# Patient Record
Sex: Male | Born: 1955 | Race: Black or African American | Hispanic: No | Marital: Single | Smoking: Current every day smoker
Health system: Southern US, Community
[De-identification: ages and names within clinical notes are randomized; demographics above are authoritative.]

## PROBLEM LIST (undated history)

## (undated) DIAGNOSIS — Z87448 Personal history of other diseases of urinary system: Secondary | ICD-10-CM

## (undated) DIAGNOSIS — I272 Pulmonary hypertension, unspecified: Secondary | ICD-10-CM

## (undated) DIAGNOSIS — K859 Acute pancreatitis without necrosis or infection, unspecified: Secondary | ICD-10-CM

## (undated) DIAGNOSIS — I499 Cardiac arrhythmia, unspecified: Secondary | ICD-10-CM

## (undated) DIAGNOSIS — K922 Gastrointestinal hemorrhage, unspecified: Secondary | ICD-10-CM

## (undated) DIAGNOSIS — J90 Pleural effusion, not elsewhere classified: Secondary | ICD-10-CM

## (undated) DIAGNOSIS — Z992 Dependence on renal dialysis: Secondary | ICD-10-CM

## (undated) DIAGNOSIS — G4700 Insomnia, unspecified: Secondary | ICD-10-CM

## (undated) DIAGNOSIS — D649 Anemia, unspecified: Secondary | ICD-10-CM

## (undated) DIAGNOSIS — N186 End stage renal disease: Secondary | ICD-10-CM

## (undated) DIAGNOSIS — F32A Depression, unspecified: Secondary | ICD-10-CM

## (undated) DIAGNOSIS — Z720 Tobacco use: Secondary | ICD-10-CM

## (undated) DIAGNOSIS — A048 Other specified bacterial intestinal infections: Secondary | ICD-10-CM

## (undated) DIAGNOSIS — I4891 Unspecified atrial fibrillation: Secondary | ICD-10-CM

## (undated) DIAGNOSIS — J45909 Unspecified asthma, uncomplicated: Secondary | ICD-10-CM

## (undated) DIAGNOSIS — G4733 Obstructive sleep apnea (adult) (pediatric): Secondary | ICD-10-CM

## (undated) DIAGNOSIS — F329 Major depressive disorder, single episode, unspecified: Secondary | ICD-10-CM

## (undated) DIAGNOSIS — M109 Gout, unspecified: Secondary | ICD-10-CM

## (undated) DIAGNOSIS — J449 Chronic obstructive pulmonary disease, unspecified: Secondary | ICD-10-CM

## (undated) DIAGNOSIS — I10 Essential (primary) hypertension: Secondary | ICD-10-CM

## (undated) HISTORY — DX: Pulmonary hypertension, unspecified: I27.20

## (undated) HISTORY — DX: Chronic obstructive pulmonary disease, unspecified: J44.9

## (undated) HISTORY — DX: Unspecified asthma, uncomplicated: J45.909

## (undated) HISTORY — PX: OTHER SURGICAL HISTORY: SHX169

## (undated) HISTORY — DX: Insomnia, unspecified: G47.00

## (undated) HISTORY — DX: Essential (primary) hypertension: I10

## (undated) HISTORY — DX: Anemia, unspecified: D64.9

## (undated) HISTORY — DX: Depression, unspecified: F32.A

## (undated) HISTORY — DX: Obstructive sleep apnea (adult) (pediatric): G47.33

## (undated) HISTORY — DX: Tobacco use: Z72.0

## (undated) HISTORY — DX: Cardiac arrhythmia, unspecified: I49.9

## (undated) HISTORY — DX: Pleural effusion, not elsewhere classified: J90

## (undated) HISTORY — DX: End stage renal disease: N18.6

## (undated) HISTORY — DX: Gastrointestinal hemorrhage, unspecified: K92.2

## (undated) HISTORY — DX: Personal history of other diseases of urinary system: Z87.448

## (undated) HISTORY — DX: Unspecified atrial fibrillation: I48.91

## (undated) HISTORY — DX: Acute pancreatitis without necrosis or infection, unspecified: K85.90

## (undated) HISTORY — DX: Dependence on renal dialysis: Z99.2

## (undated) HISTORY — DX: Major depressive disorder, single episode, unspecified: F32.9

## (undated) HISTORY — DX: Gout, unspecified: M10.9

## (undated) HISTORY — DX: Other specified bacterial intestinal infections: A04.8

---

## 2014-01-14 ENCOUNTER — Emergency Department: Payer: Self-pay | Admitting: Emergency Medicine

## 2014-01-14 LAB — CBC
HCT: 39.3 % — ABNORMAL LOW (ref 40.0–52.0)
HGB: 12.8 g/dL — ABNORMAL LOW (ref 13.0–18.0)
MCH: 31.5 pg (ref 26.0–34.0)
MCHC: 32.6 g/dL (ref 32.0–36.0)
MCV: 97 fL (ref 80–100)
Platelet: 189 10*3/uL (ref 150–440)
RBC: 4.06 10*6/uL — AB (ref 4.40–5.90)
RDW: 14.8 % — ABNORMAL HIGH (ref 11.5–14.5)
WBC: 12.1 10*3/uL — ABNORMAL HIGH (ref 3.8–10.6)

## 2014-01-14 LAB — COMPREHENSIVE METABOLIC PANEL
AST: 12 U/L — AB (ref 15–37)
Albumin: 3 g/dL — ABNORMAL LOW (ref 3.4–5.0)
Alkaline Phosphatase: 175 U/L — ABNORMAL HIGH
Anion Gap: 9 (ref 7–16)
BUN: 33 mg/dL — AB (ref 7–18)
Bilirubin,Total: 0.7 mg/dL (ref 0.2–1.0)
CALCIUM: 8.8 mg/dL (ref 8.5–10.1)
CREATININE: 6.54 mg/dL — AB (ref 0.60–1.30)
Chloride: 98 mmol/L (ref 98–107)
Co2: 29 mmol/L (ref 21–32)
EGFR (African American): 10 — ABNORMAL LOW
EGFR (Non-African Amer.): 9 — ABNORMAL LOW
GLUCOSE: 100 mg/dL — AB (ref 65–99)
OSMOLALITY: 279 (ref 275–301)
Potassium: 4.2 mmol/L (ref 3.5–5.1)
SGPT (ALT): 9 U/L — ABNORMAL LOW
Sodium: 136 mmol/L (ref 136–145)
Total Protein: 8.3 g/dL — ABNORMAL HIGH (ref 6.4–8.2)

## 2014-01-14 LAB — CK TOTAL AND CKMB (NOT AT ARMC)
CK, Total: 90 U/L
CK-MB: 1.1 ng/mL (ref 0.5–3.6)

## 2014-01-14 LAB — TROPONIN I: Troponin-I: <0.02 ng/mL

## 2014-01-28 ENCOUNTER — Inpatient Hospital Stay: Payer: Self-pay | Admitting: Internal Medicine

## 2014-01-28 ENCOUNTER — Other Ambulatory Visit: Payer: Self-pay | Admitting: Nurse Practitioner

## 2014-01-28 DIAGNOSIS — J9600 Acute respiratory failure, unspecified whether with hypoxia or hypercapnia: Secondary | ICD-10-CM

## 2014-01-28 DIAGNOSIS — I4891 Unspecified atrial fibrillation: Secondary | ICD-10-CM

## 2014-01-28 DIAGNOSIS — I369 Nonrheumatic tricuspid valve disorder, unspecified: Secondary | ICD-10-CM

## 2014-01-28 LAB — CBC WITH DIFFERENTIAL/PLATELET
Basophil #: 0 10*3/uL (ref 0.0–0.1)
Basophil %: 0.2 %
EOS PCT: 0.1 %
Eosinophil #: 0 10*3/uL (ref 0.0–0.7)
HCT: 36.5 % — AB (ref 40.0–52.0)
HGB: 11.6 g/dL — ABNORMAL LOW (ref 13.0–18.0)
LYMPHS ABS: 1.1 10*3/uL (ref 1.0–3.6)
Lymphocyte %: 4.2 %
MCH: 31.2 pg (ref 26.0–34.0)
MCHC: 31.7 g/dL — AB (ref 32.0–36.0)
MCV: 99 fL (ref 80–100)
MONO ABS: 1.6 x10 3/mm — AB (ref 0.2–1.0)
Monocyte %: 5.9 %
Neutrophil #: 24.3 10*3/uL — ABNORMAL HIGH (ref 1.4–6.5)
Neutrophil %: 89.6 %
Platelet: 155 10*3/uL (ref 150–440)
RBC: 3.71 10*6/uL — AB (ref 4.40–5.90)
RDW: 14.7 % — AB (ref 11.5–14.5)
WBC: 27.1 10*3/uL — ABNORMAL HIGH (ref 3.8–10.6)

## 2014-01-28 LAB — PROTIME-INR
INR: 1.4
Prothrombin Time: 17.1 secs — ABNORMAL HIGH (ref 11.5–14.7)

## 2014-01-28 LAB — CK-MB
CK-MB: 6.2 ng/mL — ABNORMAL HIGH (ref 0.5–3.6)
CK-MB: 4.9 ng/mL — ABNORMAL HIGH (ref 0.5–3.6)
CK-MB: 3.9 ng/mL — ABNORMAL HIGH (ref 0.5–3.6)

## 2014-01-28 LAB — PRO B NATRIURETIC PEPTIDE: B-Type Natriuretic Peptide: 97040 pg/mL — ABNORMAL HIGH (ref 0–125)

## 2014-01-28 LAB — COMPREHENSIVE METABOLIC PANEL
ALK PHOS: 132 U/L — AB
ANION GAP: 12 (ref 7–16)
Albumin: 2.5 g/dL — ABNORMAL LOW (ref 3.4–5.0)
BUN: 17 mg/dL (ref 7–18)
Bilirubin,Total: 1 mg/dL (ref 0.2–1.0)
CREATININE: 4.77 mg/dL — AB (ref 0.60–1.30)
Calcium, Total: 6.6 mg/dL — CL (ref 8.5–10.1)
Chloride: 103 mmol/L (ref 98–107)
Co2: 24 mmol/L (ref 21–32)
EGFR (African American): 15 — ABNORMAL LOW
EGFR (Non-African Amer.): 13 — ABNORMAL LOW
Glucose: 71 mg/dL (ref 65–99)
Osmolality: 278 (ref 275–301)
Potassium: 3.8 mmol/L (ref 3.5–5.1)
SGOT(AST): 33 U/L (ref 15–37)
SGPT (ALT): 12 U/L — ABNORMAL LOW
SODIUM: 139 mmol/L (ref 136–145)
Total Protein: 7.7 g/dL (ref 6.4–8.2)

## 2014-01-28 LAB — TROPONIN I
TROPONIN-I: 0.61 ng/mL — AB
Troponin-I: 0.58 ng/mL — ABNORMAL HIGH
Troponin-I: 0.7 ng/mL — ABNORMAL HIGH

## 2014-01-28 LAB — HEPARIN LEVEL (UNFRACTIONATED): Anti-Xa(Unfractionated): 0.16 IU/mL — ABNORMAL LOW (ref 0.30–0.70)

## 2014-01-28 LAB — CK TOTAL AND CKMB (NOT AT ARMC)
CK, Total: 540 U/L — ABNORMAL HIGH
CK-MB: 5.9 ng/mL — AB (ref 0.5–3.6)

## 2014-01-29 LAB — CBC WITH DIFFERENTIAL/PLATELET
BASOS PCT: 0.2 %
Basophil #: 0 10*3/uL (ref 0.0–0.1)
EOS ABS: 0 10*3/uL (ref 0.0–0.7)
Eosinophil %: 0.1 %
HCT: 30.8 % — ABNORMAL LOW (ref 40.0–52.0)
HGB: 10.1 g/dL — ABNORMAL LOW (ref 13.0–18.0)
Lymphocyte #: 0.4 10*3/uL — ABNORMAL LOW (ref 1.0–3.6)
Lymphocyte %: 2.6 %
MCH: 31.4 pg (ref 26.0–34.0)
MCHC: 32.9 g/dL (ref 32.0–36.0)
MCV: 96 fL (ref 80–100)
Monocyte #: 0.4 x10 3/mm (ref 0.2–1.0)
Monocyte %: 2.6 %
NEUTROS PCT: 94.5 %
Neutrophil #: 13.4 10*3/uL — ABNORMAL HIGH (ref 1.4–6.5)
PLATELETS: 120 10*3/uL — AB (ref 150–440)
RBC: 3.23 10*6/uL — AB (ref 4.40–5.90)
RDW: 14.7 % — AB (ref 11.5–14.5)
WBC: 14.2 10*3/uL — AB (ref 3.8–10.6)

## 2014-01-29 LAB — BASIC METABOLIC PANEL
Anion Gap: 11 (ref 7–16)
BUN: 27 mg/dL — ABNORMAL HIGH (ref 7–18)
CALCIUM: 6.7 mg/dL — AB (ref 8.5–10.1)
CHLORIDE: 96 mmol/L — AB (ref 98–107)
CO2: 25 mmol/L (ref 21–32)
Creatinine: 6.91 mg/dL — ABNORMAL HIGH (ref 0.60–1.30)
GFR CALC AF AMER: 9 — AB
GFR CALC NON AF AMER: 8 — AB
GLUCOSE: 195 mg/dL — AB (ref 65–99)
OSMOLALITY: 275 (ref 275–301)
POTASSIUM: 4 mmol/L (ref 3.5–5.1)
Sodium: 132 mmol/L — ABNORMAL LOW (ref 136–145)

## 2014-01-29 LAB — PHOSPHORUS: PHOSPHORUS: 3.2 mg/dL (ref 2.5–4.9)

## 2014-01-29 LAB — LIPID PANEL
CHOLESTEROL: 72 mg/dL (ref 0–200)
HDL: 11 mg/dL — AB (ref 40–60)
Ldl Cholesterol, Calc: 31 mg/dL (ref 0–100)
Triglycerides: 152 mg/dL (ref 0–200)
VLDL CHOLESTEROL, CALC: 30 mg/dL (ref 5–40)

## 2014-01-29 LAB — HEPARIN LEVEL (UNFRACTIONATED)
ANTI-XA(UNFRACTIONATED): 0.42 [IU]/mL (ref 0.30–0.70)
ANTI-XA(UNFRACTIONATED): 0.27 [IU]/mL — AB (ref 0.30–0.70)

## 2014-01-29 LAB — TSH: Thyroid Stimulating Horm: 3.18 u[IU]/mL

## 2014-01-30 LAB — CBC WITH DIFFERENTIAL/PLATELET
Basophil #: 0 10*3/uL (ref 0.0–0.1)
Basophil %: 0.1 %
EOS PCT: 0.2 %
Eosinophil #: 0 10*3/uL (ref 0.0–0.7)
HCT: 30.7 % — ABNORMAL LOW (ref 40.0–52.0)
HGB: 9.8 g/dL — ABNORMAL LOW (ref 13.0–18.0)
LYMPHS PCT: 6.8 %
Lymphocyte #: 0.8 10*3/uL — ABNORMAL LOW (ref 1.0–3.6)
MCH: 30.4 pg (ref 26.0–34.0)
MCHC: 31.9 g/dL — ABNORMAL LOW (ref 32.0–36.0)
MCV: 95 fL (ref 80–100)
MONOS PCT: 7.2 %
Monocyte #: 0.8 x10 3/mm (ref 0.2–1.0)
NEUTROS ABS: 10 10*3/uL — AB (ref 1.4–6.5)
NEUTROS PCT: 85.7 %
PLATELETS: 117 10*3/uL — AB (ref 150–440)
RBC: 3.22 10*6/uL — ABNORMAL LOW (ref 4.40–5.90)
RDW: 14.6 % — AB (ref 11.5–14.5)
WBC: 11.7 10*3/uL — ABNORMAL HIGH (ref 3.8–10.6)

## 2014-01-30 LAB — PHOSPHORUS: Phosphorus: 3.1 mg/dL (ref 2.5–4.9)

## 2014-01-30 LAB — HEPARIN LEVEL (UNFRACTIONATED): Anti-Xa(Unfractionated): 0.43 IU/mL (ref 0.30–0.70)

## 2014-01-31 DIAGNOSIS — I4891 Unspecified atrial fibrillation: Secondary | ICD-10-CM

## 2014-01-31 LAB — CBC WITH DIFFERENTIAL/PLATELET
Basophil #: 0 10*3/uL (ref 0.0–0.1)
Basophil %: 0.1 %
EOS ABS: 0 10*3/uL (ref 0.0–0.7)
Eosinophil %: 0 %
HCT: 31.7 % — ABNORMAL LOW (ref 40.0–52.0)
HGB: 10.4 g/dL — ABNORMAL LOW (ref 13.0–18.0)
Lymphocyte #: 1 10*3/uL (ref 1.0–3.6)
Lymphocyte %: 8.2 %
MCH: 31.1 pg (ref 26.0–34.0)
MCHC: 32.8 g/dL (ref 32.0–36.0)
MCV: 95 fL (ref 80–100)
MONO ABS: 0.9 x10 3/mm (ref 0.2–1.0)
MONOS PCT: 7.6 %
NEUTROS PCT: 84.1 %
Neutrophil #: 10.1 10*3/uL — ABNORMAL HIGH (ref 1.4–6.5)
Platelet: 127 10*3/uL — ABNORMAL LOW (ref 150–440)
RBC: 3.35 10*6/uL — ABNORMAL LOW (ref 4.40–5.90)
RDW: 14.9 % — ABNORMAL HIGH (ref 11.5–14.5)
WBC: 12 10*3/uL — ABNORMAL HIGH (ref 3.8–10.6)

## 2014-01-31 LAB — BASIC METABOLIC PANEL
ANION GAP: 10 (ref 7–16)
BUN: 31 mg/dL — ABNORMAL HIGH (ref 7–18)
CHLORIDE: 96 mmol/L — AB (ref 98–107)
Calcium, Total: 7.8 mg/dL — ABNORMAL LOW (ref 8.5–10.1)
Co2: 32 mmol/L (ref 21–32)
Creatinine: 5.32 mg/dL — ABNORMAL HIGH (ref 0.60–1.30)
EGFR (African American): 13 — ABNORMAL LOW
EGFR (Non-African Amer.): 11 — ABNORMAL LOW
GLUCOSE: 135 mg/dL — AB (ref 65–99)
OSMOLALITY: 284 (ref 275–301)
Potassium: 4 mmol/L (ref 3.5–5.1)
Sodium: 138 mmol/L (ref 136–145)

## 2014-01-31 LAB — CULTURE, BLOOD (SINGLE)

## 2014-02-01 LAB — BASIC METABOLIC PANEL
Anion Gap: 12 (ref 7–16)
BUN: 58 mg/dL — AB (ref 7–18)
CO2: 28 mmol/L (ref 21–32)
CREATININE: 7.47 mg/dL — AB (ref 0.60–1.30)
Calcium, Total: 7.9 mg/dL — ABNORMAL LOW (ref 8.5–10.1)
Chloride: 95 mmol/L — ABNORMAL LOW (ref 98–107)
EGFR (African American): 8 — ABNORMAL LOW
EGFR (Non-African Amer.): 7 — ABNORMAL LOW
Glucose: 139 mg/dL — ABNORMAL HIGH (ref 65–99)
Osmolality: 289 (ref 275–301)
POTASSIUM: 4.5 mmol/L (ref 3.5–5.1)
Sodium: 135 mmol/L — ABNORMAL LOW (ref 136–145)

## 2014-02-01 LAB — CBC WITH DIFFERENTIAL/PLATELET
Bands: 2 %
HCT: 32.1 % — AB (ref 40.0–52.0)
HGB: 10.2 g/dL — ABNORMAL LOW (ref 13.0–18.0)
LYMPHS PCT: 18 %
MCH: 30.6 pg (ref 26.0–34.0)
MCHC: 31.9 g/dL — ABNORMAL LOW (ref 32.0–36.0)
MCV: 96 fL (ref 80–100)
MONOS PCT: 6 %
MYELOCYTE: 1 %
Metamyelocyte: 1 %
NRBC/100 WBC: 1 /
Platelet: 129 10*3/uL — ABNORMAL LOW (ref 150–440)
RBC: 3.34 10*6/uL — AB (ref 4.40–5.90)
RDW: 14.6 % — ABNORMAL HIGH (ref 11.5–14.5)
SEGMENTED NEUTROPHILS: 72 %
WBC: 12.3 10*3/uL — ABNORMAL HIGH (ref 3.8–10.6)

## 2014-02-02 LAB — CBC WITH DIFFERENTIAL/PLATELET
Comment - H1-Com2: NORMAL
HCT: 32.9 % — ABNORMAL LOW (ref 40.0–52.0)
HGB: 10.5 g/dL — AB (ref 13.0–18.0)
Lymphocytes: 10 %
MCH: 30.3 pg (ref 26.0–34.0)
MCHC: 31.9 g/dL — AB (ref 32.0–36.0)
MCV: 95 fL (ref 80–100)
Metamyelocyte: 1 %
Monocytes: 8 %
Platelet: 159 10*3/uL (ref 150–440)
RBC: 3.47 10*6/uL — ABNORMAL LOW (ref 4.40–5.90)
RDW: 14.8 % — ABNORMAL HIGH (ref 11.5–14.5)
Segmented Neutrophils: 81 %
WBC: 14.6 10*3/uL — ABNORMAL HIGH (ref 3.8–10.6)

## 2014-02-02 LAB — BASIC METABOLIC PANEL
Anion Gap: 12 (ref 7–16)
BUN: 88 mg/dL — ABNORMAL HIGH (ref 7–18)
CALCIUM: 7.4 mg/dL — AB (ref 8.5–10.1)
CO2: 27 mmol/L (ref 21–32)
Chloride: 96 mmol/L — ABNORMAL LOW (ref 98–107)
Creatinine: 8.82 mg/dL — ABNORMAL HIGH (ref 0.60–1.30)
EGFR (Non-African Amer.): 6 — ABNORMAL LOW
GFR CALC AF AMER: 7 — AB
GLUCOSE: 120 mg/dL — AB (ref 65–99)
Osmolality: 298 (ref 275–301)
POTASSIUM: 4.7 mmol/L (ref 3.5–5.1)
Sodium: 135 mmol/L — ABNORMAL LOW (ref 136–145)

## 2014-02-03 ENCOUNTER — Encounter: Payer: Self-pay | Admitting: Cardiology

## 2014-02-03 DIAGNOSIS — R7989 Other specified abnormal findings of blood chemistry: Secondary | ICD-10-CM

## 2014-02-03 DIAGNOSIS — R0602 Shortness of breath: Secondary | ICD-10-CM

## 2014-02-03 DIAGNOSIS — I4891 Unspecified atrial fibrillation: Secondary | ICD-10-CM

## 2014-02-03 LAB — RENAL FUNCTION PANEL
ANION GAP: 12 (ref 7–16)
Albumin: 2.3 g/dL — ABNORMAL LOW (ref 3.4–5.0)
BUN: 102 mg/dL — ABNORMAL HIGH (ref 7–18)
CALCIUM: 7.7 mg/dL — AB (ref 8.5–10.1)
Chloride: 95 mmol/L — ABNORMAL LOW (ref 98–107)
Co2: 28 mmol/L (ref 21–32)
Creatinine: 9.77 mg/dL — ABNORMAL HIGH (ref 0.60–1.30)
GFR CALC AF AMER: 6 — AB
GFR CALC NON AF AMER: 5 — AB
Glucose: 125 mg/dL — ABNORMAL HIGH (ref 65–99)
Osmolality: 303 (ref 275–301)
PHOSPHORUS: 2.4 mg/dL — AB (ref 2.5–4.9)
Potassium: 3.9 mmol/L (ref 3.5–5.1)
Sodium: 135 mmol/L — ABNORMAL LOW (ref 136–145)

## 2014-02-03 LAB — CBC WITH DIFFERENTIAL/PLATELET
BASOS ABS: 0 10*3/uL (ref 0.0–0.1)
BASOS PCT: 0.3 %
Eosinophil #: 0.1 10*3/uL (ref 0.0–0.7)
Eosinophil %: 0.6 %
HCT: 34.5 % — AB (ref 40.0–52.0)
HGB: 10.8 g/dL — ABNORMAL LOW (ref 13.0–18.0)
LYMPHS PCT: 7.8 %
Lymphocyte #: 1 10*3/uL (ref 1.0–3.6)
MCH: 29.8 pg (ref 26.0–34.0)
MCHC: 31.3 g/dL — AB (ref 32.0–36.0)
MCV: 95 fL (ref 80–100)
Monocyte #: 0.7 x10 3/mm (ref 0.2–1.0)
Monocyte %: 5.2 %
Neutrophil #: 11.5 10*3/uL — ABNORMAL HIGH (ref 1.4–6.5)
Neutrophil %: 86.1 %
Platelet: 207 10*3/uL (ref 150–440)
RBC: 3.63 10*6/uL — AB (ref 4.40–5.90)
RDW: 14.7 % — AB (ref 11.5–14.5)
WBC: 13.4 10*3/uL — AB (ref 3.8–10.6)

## 2014-02-03 LAB — AEROBIC CULTURE

## 2014-02-05 LAB — CULTURE, BLOOD (SINGLE)

## 2014-02-08 LAB — CULTURE, BLOOD (SINGLE)

## 2014-03-12 ENCOUNTER — Ambulatory Visit: Payer: Self-pay | Admitting: Vascular Surgery

## 2014-03-12 LAB — BASIC METABOLIC PANEL
ANION GAP: 7 (ref 7–16)
BUN: 34 mg/dL — ABNORMAL HIGH (ref 7–18)
Calcium, Total: 8.1 mg/dL — ABNORMAL LOW (ref 8.5–10.1)
Chloride: 104 mmol/L (ref 98–107)
Co2: 25 mmol/L (ref 21–32)
Creatinine: 7.66 mg/dL — ABNORMAL HIGH (ref 0.60–1.30)
Glucose: 94 mg/dL (ref 65–99)
Osmolality: 279 (ref 275–301)
POTASSIUM: 3.9 mmol/L (ref 3.5–5.1)
SODIUM: 136 mmol/L (ref 136–145)

## 2014-06-02 ENCOUNTER — Ambulatory Visit: Payer: Self-pay | Admitting: Vascular Surgery

## 2014-06-02 LAB — CBC
HCT: 41.6 % (ref 40.0–52.0)
HGB: 13.6 g/dL (ref 13.0–18.0)
MCH: 30.1 pg (ref 26.0–34.0)
MCHC: 32.7 g/dL (ref 32.0–36.0)
MCV: 92 fL (ref 80–100)
Platelet: 157 10*3/uL (ref 150–440)
RBC: 4.52 10*6/uL (ref 4.40–5.90)
RDW: 16.8 % — ABNORMAL HIGH (ref 11.5–14.5)
WBC: 7 10*3/uL (ref 3.8–10.6)

## 2014-06-02 LAB — MRSA PCR SCREENING

## 2014-06-02 LAB — BASIC METABOLIC PANEL
ANION GAP: 8 (ref 7–16)
BUN: 34 mg/dL — AB (ref 7–18)
CHLORIDE: 100 mmol/L (ref 98–107)
CREATININE: 6.65 mg/dL — AB (ref 0.60–1.30)
Calcium, Total: 8.1 mg/dL — ABNORMAL LOW (ref 8.5–10.1)
Co2: 27 mmol/L (ref 21–32)
EGFR (African American): 11 — ABNORMAL LOW
GFR CALC NON AF AMER: 9 — AB
Glucose: 84 mg/dL (ref 65–99)
Osmolality: 277 (ref 275–301)
POTASSIUM: 3.8 mmol/L (ref 3.5–5.1)
Sodium: 135 mmol/L — ABNORMAL LOW (ref 136–145)

## 2014-06-18 ENCOUNTER — Inpatient Hospital Stay: Payer: Self-pay | Admitting: Specialist

## 2014-06-18 DIAGNOSIS — I4891 Unspecified atrial fibrillation: Secondary | ICD-10-CM

## 2014-06-18 DIAGNOSIS — N186 End stage renal disease: Secondary | ICD-10-CM

## 2014-06-18 LAB — BASIC METABOLIC PANEL
ANION GAP: 8 (ref 7–16)
BUN: 22 mg/dL — ABNORMAL HIGH (ref 7–18)
CHLORIDE: 102 mmol/L (ref 98–107)
CO2: 27 mmol/L (ref 21–32)
Calcium, Total: 7.7 mg/dL — ABNORMAL LOW (ref 8.5–10.1)
Creatinine: 5.29 mg/dL — ABNORMAL HIGH (ref 0.60–1.30)
EGFR (African American): 15 — ABNORMAL LOW
EGFR (Non-African Amer.): 12 — ABNORMAL LOW
Glucose: 86 mg/dL (ref 65–99)
Osmolality: 276 (ref 275–301)
Potassium: 4 mmol/L (ref 3.5–5.1)
Sodium: 137 mmol/L (ref 136–145)

## 2014-06-18 LAB — CBC WITH DIFFERENTIAL/PLATELET
Basophil #: 0.1 10*3/uL (ref 0.0–0.1)
Basophil %: 0.9 %
EOS PCT: 2.5 %
Eosinophil #: 0.2 10*3/uL (ref 0.0–0.7)
HCT: 40.3 % (ref 40.0–52.0)
HGB: 13 g/dL (ref 13.0–18.0)
LYMPHS ABS: 1.6 10*3/uL (ref 1.0–3.6)
Lymphocyte %: 16.8 %
MCH: 29.9 pg (ref 26.0–34.0)
MCHC: 32.4 g/dL (ref 32.0–36.0)
MCV: 93 fL (ref 80–100)
MONO ABS: 0.8 x10 3/mm (ref 0.2–1.0)
Monocyte %: 8.3 %
Neutrophil #: 6.6 10*3/uL — ABNORMAL HIGH (ref 1.4–6.5)
Neutrophil %: 71.5 %
Platelet: 189 10*3/uL (ref 150–440)
RBC: 4.36 10*6/uL — ABNORMAL LOW (ref 4.40–5.90)
RDW: 16.5 % — ABNORMAL HIGH (ref 11.5–14.5)
WBC: 9.3 10*3/uL (ref 3.8–10.6)

## 2014-06-18 LAB — MAGNESIUM: Magnesium: 1.7 mg/dL — ABNORMAL LOW

## 2014-07-03 ENCOUNTER — Encounter: Payer: Self-pay | Admitting: Cardiovascular Disease

## 2014-07-03 ENCOUNTER — Ambulatory Visit (INDEPENDENT_AMBULATORY_CARE_PROVIDER_SITE_OTHER): Payer: Medicare Other | Admitting: Cardiovascular Disease

## 2014-07-03 VITALS — BP 140/100 | HR 111 | Ht 72.0 in | Wt 197.8 lb

## 2014-07-03 DIAGNOSIS — Z8719 Personal history of other diseases of the digestive system: Secondary | ICD-10-CM | POA: Insufficient documentation

## 2014-07-03 DIAGNOSIS — N186 End stage renal disease: Secondary | ICD-10-CM | POA: Insufficient documentation

## 2014-07-03 DIAGNOSIS — Z01810 Encounter for preprocedural cardiovascular examination: Secondary | ICD-10-CM

## 2014-07-03 DIAGNOSIS — J439 Emphysema, unspecified: Secondary | ICD-10-CM

## 2014-07-03 DIAGNOSIS — F17200 Nicotine dependence, unspecified, uncomplicated: Secondary | ICD-10-CM | POA: Insufficient documentation

## 2014-07-03 DIAGNOSIS — I10 Essential (primary) hypertension: Secondary | ICD-10-CM

## 2014-07-03 DIAGNOSIS — Z992 Dependence on renal dialysis: Secondary | ICD-10-CM

## 2014-07-03 DIAGNOSIS — Z720 Tobacco use: Secondary | ICD-10-CM

## 2014-07-03 DIAGNOSIS — I4891 Unspecified atrial fibrillation: Secondary | ICD-10-CM | POA: Insufficient documentation

## 2014-07-03 DIAGNOSIS — J90 Pleural effusion, not elsewhere classified: Secondary | ICD-10-CM | POA: Insufficient documentation

## 2014-07-03 MED ORDER — METOPROLOL SUCCINATE ER 50 MG PO TB24: 50.0000 mg | ORAL_TABLET | Freq: Every day | ORAL | Status: DC

## 2014-07-03 MED ORDER — DILTIAZEM HCL ER COATED BEADS 120 MG PO CP24: 120.0000 mg | ORAL_CAPSULE | Freq: Two times a day (BID) | ORAL | Status: AC

## 2014-07-03 NOTE — Assessment & Plan Note (Signed)
Recommended that he start on his diltiazem twice a day for 1 week, then introduce the metoprolol every morning. This should help with heart rate control, then he should be fine for AV fistula surgery.

## 2014-07-03 NOTE — Assessment & Plan Note (Signed)
Notes indicate several prior GI bleeds in 2009, 2011. Possibly in the setting of NSAIDs and alcohol

## 2014-07-03 NOTE — Assessment & Plan Note (Addendum)
Heart rate continues to run fast on today's visit. We will increase diltiazem to up to 120 mg twice a day. Also had metoprolol succinate 25 mg every morning We have recommended that he hold his morning medications prior to dialysis. Recommended that he take his morning medications prior to AV fistula surgery If he has worsening shortness of breath, we will hold the metoprolol as this can cause bronchospasm and he continues to smoke. We have discussed the risk and benefit of anticoagulation with him. He has a history of GI bleeding in the past, also on dialysis. For now we have suggested he not go on aggressive anticoagulation given his prior risk and the need for upcoming surgery

## 2014-07-03 NOTE — Assessment & Plan Note (Signed)
We have encouraged him to continue to work on weaning his cigarettes and smoking cessation. He will continue to work on this and does not want any assistance with chantix.  

## 2014-07-03 NOTE — Progress Notes (Signed)
Patient ID: Blake Herrera, male    DOB: April 24, 1956, 59 y.o.   MRN: [id number]  HPI Comments: Blake Herrera is a pleasant 59 year old [place] with chronic atrial fibrillation, end-stage renal disease on hemodialysis Tuesday, Thursday, Saturday, 2 failed AV fistulas who receives dialysis through a left chest subclavian catheter, history of GI bleed in 2009 and 2011 from NSAIDs and alcohol, previous [facility]up in July 2015 at [facility] for persistent loculated left pleural effusion who underwent thoracentesis mid July showing lymphocytic effusion, chronic smoker 1 pack per day, who presents to establish care in the [place] for atrial fibrillation. He was recently in the hospital general 6 2016 for AV fistula surgery. He was getting on the operating table when he developed tachycardia with rate 150 bpm. Surgery was canceled and we  were consulted.  He was started on diltiazem infusion, improvement of his rate and he was discharged home the same day.  On today's visit, he denies any significant symptoms of shortness of breath. He has a chronic cough. Denies any leg edema, no PND or orthopnea.  He sees Dr. Thedore Herrera of renal, Dr. Lorretta Herrera of vascular .  He denies any significant chest pain symptoms with exertion.  He does report that his blood pressure gets low at the end of dialysis   EKG on today's visit shows atrial fibrillation with ventricular rate 113 bpm, no significant ST or T-wave changes   Other workup reviewed with him.  Chest x-ray 06/18/2014 showing persistent left lower lobe atelectasis or pneumonia with small left pleural effusion  Echocardiogram 01/28/2014 showing normal LV systolic function, moderately dilated right ventricle with mildly reduced systolic function, moderately dilated right atrium, mildly elevated right ventricular systolic pressure  Stress test 02/03/2014 showing no ischemia    No Known Allergies  Outpatient Encounter Prescriptions as of 07/03/2014  Medication Sig  .  albuterol (PROVENTIL HFA;VENTOLIN HFA) 108 (90 BASE) MCG/ACT inhaler Inhale 2 puffs into the lungs every 6 (six) hours as needed for wheezing or shortness of breath.  . budesonide-formoterol (SYMBICORT) 160-4.5 MCG/ACT inhaler Inhale 2 puffs into the lungs 2 (two) times daily.  . Calcium Acetate 667 MG TABS Take 667 mg by mouth 3 (three) times daily.  . furosemide (LASIX) 80 MG tablet Take 80 mg by mouth 2 (two) times daily.  . sevelamer (RENAGEL) 800 MG tablet Take 2,400 mg by mouth 3 (three) times daily with meals.  . tamsulosin (FLOMAX) 0.4 MG CAPS capsule Take 0.4 mg by mouth daily.  Marland Kitchen. tiotropium (SPIRIVA) 18 MCG inhalation capsule Place 18 mcg into inhaler and inhale daily.  Marland Kitchen.  diltiazem (CARDIZEM CD) 120 MG 24 hr capsule Take 120 mg by mouth daily.    Past Medical History  Diagnosis Date  . Asthma   . ESRD (end stage renal disease)   . COPD, severe   . GIB (gastrointestinal bleeding)   . OSA (obstructive sleep apnea)   . Anemia   . H. pylori infection   . Insomnia   . A-fib   . Pulmonary HTN   . Depression   . Tobacco abuse   . Pleural effusion   . Gout   . H/O pyelonephritis   . Pancreatitis   . Hypertension   . Dialysis patient   . A-fib   . Arrhythmia     a-fib    History reviewed. No pertinent past surgical history.  Social History  reports that he has been smoking Cigarettes.  He has a 17.5 pack-year smoking  history. He does not have any smokeless tobacco history on file. He reports that he does not drink alcohol or use illicit drugs.  Family History family history includes Hyperlipidemia in his father and mother; Hypertension in his brother, father, and mother.      Review of Systems  Constitutional: Negative.   Respiratory: Negative.   Cardiovascular: Negative.   Gastrointestinal: Negative.   Musculoskeletal: Negative.   Skin: Negative.   Neurological: Negative.   Hematological: Negative.   Psychiatric/Behavioral: Negative.   All other systems  reviewed and are negative.   BP 140/100 mmHg  Pulse 111  Ht 6' (1.829 m)  Wt 197 lb 12 oz (89.699 kg)  BMI 26.81 kg/m2  Physical Exam  Constitutional: He is oriented to person, place, and time. He appears well-developed and well-nourished.  HENT:  Head: Normocephalic.  Nose: Nose normal.  Mouth/Throat: Oropharynx is clear and moist.  Eyes: Conjunctivae are normal. Pupils are equal, round, and reactive to light.  Neck: Normal range of motion. Neck supple. No JVD present.  Cardiovascular: S1 normal, S2 normal, normal heart sounds and intact distal pulses.  An irregularly irregular rhythm present. Tachycardia present.  Exam reveals no gallop and no friction rub.   No murmur heard. Pulmonary/Chest: Effort normal and breath sounds normal. No respiratory distress. He has no wheezes. He has no rales. He exhibits no tenderness.  Abdominal: Soft. Bowel sounds are normal. He exhibits no distension. There is no tenderness.  Musculoskeletal: Normal range of motion. He exhibits no edema or tenderness.  Lymphadenopathy:    He has no cervical adenopathy.  Neurological: He is alert and oriented to person, place, and time. Coordination normal.  Skin: Skin is warm and dry. No rash noted. No erythema.  Psychiatric: He has a normal mood and affect. His behavior is normal. Judgment and thought content normal.      Assessment and Plan   Nursing note and vitals reviewed.

## 2014-07-03 NOTE — Assessment & Plan Note (Signed)
COPD from long history of smoking. Chronic mild baseline shortness of breath

## 2014-07-03 NOTE — Assessment & Plan Note (Signed)
Followed by Dr. Thedore MinsSingh, on dialysis 3 days per week

## 2014-07-03 NOTE — Assessment & Plan Note (Signed)
Prior history of thoracentesis in July 2015. Recent chest x-ray with only small pleural effusion

## 2014-07-03 NOTE — Patient Instructions (Addendum)
You are doing well.  Heart rate is fast (atrial fibrillation) Please take diltiazem (cardizem) 120 mg twice a day   After one week, then Start metoprolol one a day in the morning  Tuesday/thursday and Saturday take the morning diltiazem and metoprolol after dialysis  The day of the surgery, take your metoprolol and diltiazem (Am dosing)  Please call us if you have new issues that need to be addressed before your next appt.  Your physician wants you to follow-up in: 6 months.  You will receive a reminder letter in the mail two months in advance. If you don't receive a letter, please call our office to schedule the follow-up appointment.

## 2014-07-03 NOTE — Assessment & Plan Note (Signed)
Medication changes as above 

## 2014-07-16 ENCOUNTER — Ambulatory Visit: Payer: Self-pay | Admitting: Vascular Surgery

## 2014-07-16 LAB — CBC
HCT: 40.2 % (ref 40.0–52.0)
HGB: 13.1 g/dL (ref 13.0–18.0)
MCH: 30 pg (ref 26.0–34.0)
MCHC: 32.6 g/dL (ref 32.0–36.0)
MCV: 92 fL (ref 80–100)
Platelet: 165 10*3/uL (ref 150–440)
RBC: 4.37 10*6/uL — AB (ref 4.40–5.90)
RDW: 15.8 % — ABNORMAL HIGH (ref 11.5–14.5)
WBC: 9.4 10*3/uL (ref 3.8–10.6)

## 2014-07-16 LAB — BASIC METABOLIC PANEL
Anion Gap: 7 (ref 7–16)
BUN: 35 mg/dL — AB (ref 7–18)
Calcium, Total: 7.4 mg/dL — ABNORMAL LOW (ref 8.5–10.1)
Chloride: 98 mmol/L (ref 98–107)
Co2: 29 mmol/L (ref 21–32)
Creatinine: 6.83 mg/dL — ABNORMAL HIGH (ref 0.60–1.30)
EGFR (African American): 11 — ABNORMAL LOW
EGFR (Non-African Amer.): 9 — ABNORMAL LOW
GLUCOSE: 93 mg/dL (ref 65–99)
Osmolality: 276 (ref 275–301)
POTASSIUM: 3.5 mmol/L (ref 3.5–5.1)
SODIUM: 134 mmol/L — AB (ref 136–145)

## 2014-07-16 LAB — MRSA PCR SCREENING

## 2014-07-25 ENCOUNTER — Inpatient Hospital Stay: Payer: Self-pay | Admitting: Vascular Surgery

## 2014-10-04 NOTE — H&P (Signed)
PATIENT NAME:  Blake Herrera, Joas E MR#:  [id number] DATE OF BIRTH:  03-06-1956  DATE OF ADMISSION:  01/28/2014  [facility] C[facility]  [facility].  EMERGENCY DEPARTMENT REFERRING PHYSICIAN: Dr. Shaune PollackLord.   CHIEF COMPLAINT: Shortness of breath,  atrial fibrillation with RVR.   HISTORY OF PRESENT ILLNESS: The patient is a 59 year old African American male who has a history of end-stage renal disease, COPD, atrial fibrillation, and BPH, who was seen here about a month ago with shortness of breath. At that time got dialyzed and was discharged from the Emergency Room, who went to get his dialysis today and was noted to have a temperature of 100.8. The patient was very short of breath and hypoxic. He was placed on a BiPAP and subsequently sent here. The patient was in the ED, continues to be on BiPAP. His heart rate is noted to be in the 140s. He was in atrial fibrillation with RVR. He was started on a Cardizem drip. The patient otherwise denies any chest pain. Denies any nausea, vomiting, or diarrhea. Denies any fevers that he can recall. He is denying any headaches or neck stiffness.   PAST MEDICAL HISTORY:  1.  Significant for end-stage renal disease on dialysis Tuesday, Thursday, and Saturday.  2. Questionable history of atrial fibrillation. 3.  History of COPD.   4.  BPH.   PAST SURGICAL HISTORY:   access.   ALLERGIES: None.   CURRENT MEDICATIONS AT HOME:  Symbicort 160/4.5 two puffs b.i.d., Spiriva 18 mcg daily, Renagel 800 mg 3 tabs t.i.d., Proventil 2 puffs q. 6 p.r.n., metoprolol succinate 100 mg 1 tab p.o. b.i.d., Lasix 80 mg 1 tablet p.o. b.i.d., Flomax 0.4 daily, calcium acetate 667 two t.i.d.   SOCIAL HISTORY: Smokes half pack per day. No alcohol or drug use.   FAMILY HISTORY: Positive for hypertension.    SOCIAL HISTORY: Continues to smoke half pack per day. No alcohol or drug use.   REVIEW OF SYSTEMS: CONSTITUTIONAL: The patient denies any fevers. Complains of fatigue, weakness. No  pain. No weight loss. No weight gain.  EYES: No blurred or double vision. No redness. No inflammation.  ENT: No tinnitus, ear pain. No hearing loss. No seasonal or year-round allergies. No epistaxis. No nasal discharge. No difficulty swallowing.  RESPIRATORY: Denies any cough, wheezing, or hemoptysis. Has COPD.   CARDIOVASCULAR: Denies any chest pain, orthopnea, edema. Has a history of atrial fibrillation.  GASTROINTESTINAL: No nausea, vomiting, diarrhea. No abdominal pain. No hematemesis.  GENITOURINARY: Denies any dysuria, hematuria, renal calculus, or frequency.  ENDOCRINE: Denies any polyuria, nocturia, or thyroid problems.  HEMATOLOGIC AND LYMPHATIC: Denies anemia, easy bruisability, or bleeding.  SKIN: No acne. No rash.  MUSCULOSKELETAL: Denies any pain in the neck, back, or shoulder.  NEUROLOGIC: No numbness, CVA, TIA, or seizures.  PSYCHIATRIC: No anxiety, insomnia, or ADD.   PHYSICAL EXAMINATION: VITAL SIGNS: Temperature 98.5, pulse 144, respirations 27, blood pressure 102/85.  GENERAL: The patient is a critically ill-appearing male with some accessory muscle usage.  HEENT: Head atraumatic, normocephalic. Pupils equally round, reactive to light and accommodation. There is no conjunctival pallor. No scleral icterus. Extraocular movements intact. Nasal exam shows no nasal lesions or drainage. Ear exam shows no drainage or external lesions. Mouth is moist without any exudate.  NECK: Supple and symmetric. No masses. Thyroid midline.  RESPIRATORY: He has bilateral wheezing throughout both lungs. Accessory muscle usage.  CARDIOVASCULAR: Tachycardic, irregularly irregular heart rhythm.  GASTROINTESTINAL: The patient is nontender, nondistended. No hepatosplenomegaly. Positive bowel  sounds x 4. No guarding or rebound.  GENITOURINARY: Deferred.  MUSCULOSKELETAL: There is no erythema or swelling.  SKIN: There is no rash.  LYMPHATICS: No lymph nodes palpable.  VASCULAR: Good DP, PT pulses.   NEUROLOGIC: Cranial nerves II through XII grossly intact. No focal deficits.  PSYCHIATRIC: Not anxious or depressed.   DIAGNOSTIC DATA: Evaluation in the ED, PA and lateral chest x-ray shows left basilar chest densities compatible with pleural fluid and consolidation. ABG with pH of 7.34, pCO2 of 48, pO2 of 197. Lactic acid 1.6. CPK 540, CK-MB 5.9. WBC 27.1, hemoglobin 11.6, platelet count is 155,000. Glucose 71, BUN 17, creatinine 4.77, sodium 139, potassium 3.8, chloride 103, CO2 of 24, calcium 6.6, albumin 2.5. Troponin 0.58.   ASSESSMENT AND PLAN: The patient is a 59 year old African American male who presents with acute respiratory failure noted to be in atrial fibrillation with rapid ventricular response.  1.  Acute respiratory failure due to pneumonia, chronic obstructive pulmonary disease flare. At this time, will treat him with IV antibiotics  for possible healthcare associated pneumonia in light of the patient being on dialysis. Also for chronic obstructive pulmonary disease exacerbation, I will place him on Xopenex. In light of his heart rate being elevated, IV Solu-Medrol. A pulmonary consult will be obtained. We will repeat a chest x-ray and ABG in the morning.  2.  Atrial fibrillation with rapid ventricular response, on Cardizem drip. I will try IV Digoxin x 1 if no response, may need amiodarone drip. I have discussed the case with Dr. Mariah Milling who will come and see the patient. Will obtain echocardiogram of the heart. [facility]  3.  End-stage renal disease. Nephrology has been consulted. I spoke to Dr. Cherylann Ratel.  4.  BPH. Will continue Flomax.  5.  Nicotine addiction. Smoking cessation given to the patient, 4 minutes spent. Nicotine patch started. I recommended the patient strongly stop smoking. Smoking cessation time spent on this patient 4 minutes.   6.  Elevated troponin in the setting of end-stage renal disease, likely due to poor renal clearance.  7.  Possible demand  ischemia. Will follow his cardiac enzymes. Cardiology has been consulted.  I will place him on aspirin 81 mg 1 tab p.o. daily.   time spent 50 min ____________________________ Lacie Scotts. Allena Katz, MD shp:at D: 01/28/2014 13:38:00 ET T: 01/28/2014 14:22:26 ET JOB#: [id number]  cc: Shabria Egley H. Allena Katz, MD, <Dictator> Charise Carwin MD ELECTRONICALLY SIGNED 02/02/2014 14:03

## 2014-10-04 NOTE — Consult Note (Signed)
General Aspect Cardiologist:  New ____________   Past Medical History 1. Asthma 07/09/2013  2. ESRD (end stage renal disease) on dialysis x 1 yr.       a. s/p failed AVFs in both forearms.      b. Dialysis currently through left chest subclavian line. 3. COPD, severe  4. h/o GIB (duodenal ulcer) - 2009, 2011  5. OSA (obstructive sleep apnea)   6. Anemia   7. H. pylori infection  8. Insomnia  9. Pulmonary hypertension  10. A-fib       a. 05/2013 - ECG from [facility] showed afib @ 114 - pt unsure if this is persistent or paroxysmal. 11. Constipation  12. Depression  13. Tobacco abuse  14. Hypertension   15. Persistent loculated left pleural effusion 12/2013:          a. 12/2013 Thoracentesis [facility]):  Lymphocytic effusion, Mesothelial cells, macrophages, rare eosinophils and fibrin on cell block. 16. Injury of fifth finger of right hand 17. Gout 18. H/O pyelonephritis - 2009 19. H/O pancreatitis - 1995 _____________   Present Illness 59 y/o male with the above complex medical history.   He has ESRD and has been on dialysis x ~ 1 yr (T, Th, F in [place]).  He's had two failed AVF (one in each forearm) and receives dialysis via a left chest subclavian catheter.  He also has a h/o afib, though he is unsure if it is persistent or paroxysmal.  I have reviewed records from [facility] in [place] everywhere, and was able to find that he has a h/o afib dating back to at least 01/2013, with an ECG in 05/2013 showing afib, however again, it is not clear if this is paroxysmal or persistent as I cannot find any notes addressing this.  He has never seen cardiology for it and I cannot find an echo in the [facility] records.  He has never been on anticoagulation but does have a h/o GIB x 2, 2009 and 2011, apparently in the setting of nsaids and etoh.  In July of this year, he was worked up @ [facility] for a persistent loculated left pleural effusion.  He doesn't recall the details as to how that was discovered.  Regardless, he  was seen @ [facility] and underwent thoracentesis in mid-July revealing a lymphocytic effusion with mesothelial cells, macrophages, rare eosinophils, and fibrin.  He lives with his dtr and grandchildren in [place].  He smokes 1 ppd.  He says that at baseline, he is capable of being fairly active w/o limitations.  On Saturday, he noted general malasie with reduced appetite.  He rested during the day and didn't sleep well that night.  Anorexia persisted on Sunday and Monday and he says that maybe he started to notice that his HR was elevated.  He denies fevers or chills.  He presented to dialysis this AM and was noted to be tachycardic by the nurse (130's - I called and spoke with her) and also febrile.  Dialysis was initiated and he was placed on a pulse-ox.  HR's stabilized in the 70's and 80's but about 2 hrs into dialysis, his HR elevated into the 150's and he c/o dyspnea.  Dialysis was halted and EMS was called.  He was found to be in afib with RVR.  He was taken via EMS to [facility] ED and placed on BiPap (now off).  Labs revealed - BNP 97K, Creat 4.77, CK 540, MB 5.9, Ti 0.58, WBC 27.1.  CXR showed persistent  L basilar chest densities, pleural fluid/consolidation.  He has been placed on Dilt gtt, inhalers, steroids, and broad spectrum abx.  We have been asked to eval. Currently he denies chest pain or dyspnea @ rest.   Physical Exam:  GEN well developed, well nourished, no acute distress, pleasant   HEENT pink conjunctivae, moist oral mucosa, poor dentition   NECK supple  no bruits/jvd.   RESP normal resp effort  diminished breath sounds bilat L worse than R with exp wheezing throughout.   CARD Irregular rate and rhythm  Tachycardic   ABD denies tenderness  normal BS  firm   LYMPH negative neck   EXTR negative cyanosis/clubbing, trace bilat LEE.   SKIN normal to palpation   NEURO cranial nerves intact, motor/sensory function intact   PSYCH alert, A+O to time, place, person   Review of Systems:   Subjective/Chief Complaint SOB   General: General malaise/anorexia since Saturday.   Skin: No Complaints   ENT: No Complaints   Eyes: No Complaints   Neck: No Complaints   Respiratory: Short of breath  Wheezing   Cardiovascular: Palpitations  Dyspnea   Gastrointestinal: Anorexia   Genitourinary: No Complaints   Vascular: No Complaints   Musculoskeletal: No Complaints   Neurologic: No Complaints   Hematologic: No Complaints   Endocrine: No Complaints   Psychiatric: No Complaints   Review of Systems: All other systems were reviewed and found to be negative   Medications/Allergies Reviewed Medications/Allergies reviewed   Family & Social History:  Family and Social History:  Family History No premature CAD.  Sister had cancer.   Social History negative Illicit drugs, 79+ pack year h/o tobacco abuse, currentl smoking 1 ppd  [facility] records indicate prior ETOH abuse but pt denies.   Place of Living Home  Lives in [place] with dtr and grandchildren.  Still works Air traffic controller.          Admit Diagnosis:   ACUTE RESPIRATORY FAILRUE: Onset Date: 28-Jan-2014, Status: Active, Description: ACUTE RESPIRATORY FAILRUE  Home Medications: Medication Instructions Status  Renagel 800 mg oral tablet 3 tab(s) orally 3 times a day and 2 with snacks Active  Proventil CFC free 90 mcg/inh inhalation aerosol 2 puff(s) inhaled every 6 hours, As Needed Active  Lasix 80 mg oral tablet 1 tab(s) orally 2 times a day Active  metoprolol succinate 100 mg oral tablet, extended release 1 tab(s) orally 2 times a day Active  Spiriva 18 mcg inhalation capsule 1 each inhaled once a day Active  Symbicort 160 mcg-4.5 mcg/inh inhalation aerosol 2 puff(s) inhaled 2 times a day Active  calcium acetate 667 mg oral tablet 2 tab(s) orally 3 times a day Active  Flomax 0.4 mg oral capsule 1 cap(s) orally once a day (at bedtime) Active   Lab Results:  Hepatic:  18-Aug-15 10:21   Bilirubin, Total 1.0   Alkaline Phosphatase  132 (46-116 NOTE: New Reference Range 12/31/13)  SGPT (ALT)  12 (14-63 NOTE: New Reference Range 12/31/13)  SGOT (AST) 33  Total Protein, Serum 7.7  Albumin, Serum  2.5  Routine Micro:  18-Aug-15 10:21   Micro Text Report BLOOD CULTURE   COMMENT                   NO GROWTH IN 8-12 HOURS   ANTIBIOTIC                       Specimen Source right ac  Culture Comment NO GROWTH IN  8-12 HOURS  Result(s) reported on 28 Jan 2014 at 06:00PM.  Lab:  18-Aug-15 10:40   Lactic Acid, Cardiopulmonary  1.6 (0.3-0.8 NOTE: New Reference Range 01/21/14)  pH (ABG)  7.34 (7.350-7.450 NOTE: New Reference Range 01/04/14)  PCO2 48 (32-48 NOTE: New Reference Range 01/21/14)  PO2  197 (83-108 NOTE: New Reference Range 01/04/14)  FiO2 80  Base Excess -0.4 (-3-3 NOTE: New Reference Range 01/21/14)  HCO3 25.9 (21.0-28.0 NOTE: New Reference Range 01/04/14)  O2 Saturation 98.0  O2 Device BIPAP  Specimen Site (ABG) LT RADIAL  Specimen Type (ABG) ARTERIAL  Patient Temp (ABG) 37.0  PSV 10  CPAP 5.0  Mechanical Rate 8 (Result(s) reported on 28 Jan 2014 at 10:45AM.)  Routine Chem:  18-Aug-15 10:21   B-Type Natriuretic Peptide [facility])  [phone] (Result(s) reported on 28 Jan 2014 at 11:33AM.)  Glucose, Serum 71  BUN 17  Creatinine (comp)  4.77  Sodium, Serum 139  Potassium, Serum 3.8  Chloride, Serum 103  CO2, Serum 24  Calcium (Total), Serum  6.6  Osmolality (calc) 278  eGFR (African American)  15  eGFR (Non-African American)  13 (eGFR values <53m/min/1.73 m2 may be an indication of chronic kidney disease (CKD). Calculated eGFR is useful in patients with stable renal function. The eGFR calculation will not be reliable in acutely ill patients when serum creatinine is changing rapidly. It is not useful in  patients on dialysis. The eGFR calculation may not be applicable to patients at the low and high extremes of body sizes, pregnant women, and vegetarians.)  Result  Comment Calcium - RESULTS VERIFIED BY REPEAT TESTING.  - NOTIFIED OF CRITICAL VALUE  - Notified MLarna Daughters@ [phone]  - 02/28/14 by SFJ  - READ-BACK PROCESS PERFORMED.  Result(s) reported on 28 Jan 2014 at 10:46AM.  Result Comment troponin - RESULTS VERIFIED BY REPEAT TESTING.  - Notified MLarna Daughters@ [id number] - 01/28/14.SFJ  - READ-BACK PROCESS PERFORMED.  Result(s) reported on 28 Jan 2014 at 11:09AM.  Anion Gap 12  Cardiac:  18-Aug-15 10:21   CPK-MB, Serum  5.9 (Result(s) reported on 28 Jan 2014 at 11:09AM.)  CK, Total  540 (39-308 NOTE: NEW REFERENCE RANGE  07/15/2013)  Troponin I  0.58 (0.00-0.05 0.05 ng/mL or less: NEGATIVE  Repeat testing in 3-6 hrs  if clinically indicated. >0.05 ng/mL: POTENTIAL  MYOCARDIAL INJURY. Repeat  testing in 3-6 hrs if  clinically indicated. NOTE: An increase or decrease  of 30% or more on serial  testing suggests a  clinically important change)  Routine Coag:  18-Aug-15 10:21   Prothrombin  17.1  INR 1.4 (INR reference interval applies to patients on anticoagulant therapy. A single INR therapeutic range for coumarins is not optimal for all indications; however, the suggested range for most indications is 2.0 - 3.0. Exceptions to the INR Reference Range may include: Prosthetic heart valves, acute myocardial infarction, prevention of myocardial infarction, and combinations of aspirin and anticoagulant. The need for a higher or lower target INR must be assessed individually. Reference: The Pharmacology and Management of the Vitamin K  antagonists: the seventh ACCP Conference on Antithrombotic and Thrombolytic Therapy. CGYJEH.6314Sept:126 (3suppl): [id number] A HCT value >55% may artifactually increase the PT.  In one study,  the increase was an average of 25%. Reference:  "Effect on Routine and Special Coagulation Testing Values of Citrate Anticoagulant Adjustment in Patients with High HCT Values." American Journal of Clinical  Pathology 2006;126:400-405.)  Routine Hem:  18-Aug-15 10:21  WBC (CBC)  27.1  RBC (CBC)  3.71  Hemoglobin (CBC)  11.6  Hematocrit (CBC)  36.5  Platelet Count (CBC) 155  MCV 99  MCH 31.2  MCHC  31.7  RDW  14.7  Neutrophil % 89.6  Lymphocyte % 4.2  Monocyte % 5.9  Eosinophil % 0.1  Basophil % 0.2  Neutrophil #  24.3  Lymphocyte # 1.1  Monocyte #  1.6  Eosinophil # 0.0  Basophil # 0.0 (Result(s) reported on 28 Jan 2014 at 10:46AM.)   EKG:  EKG Interp. by me   Interpretation EKG shows afib, 183, inf infarct, poor r progression - no acute st/t changes. Tele in the ER with rate 110 bpm   Radiology Results:  XRay:    18-Aug-15 10:43, Chest Portable Single View  Chest Portable Single View   REASON FOR EXAM:    DYSPNEA  COMMENTS:       PROCEDURE: DXR - DXR PORTABLE CHEST SINGLE VIEW  - Jan 28 2014 10:43AM     CLINICAL DATA:  Tachycardia and low saturations.    EXAM:  PORTABLE CHEST - 1 VIEW    COMPARISON:  01/14/2014    FINDINGS:  There is a left jugular dialysis catheter. Catheter tip is in the  lower SVC region. Cardiac pads overlying the left side of the chest.  Left basilar densities suggest consolidation and pleural fluid.  Heart is obscured by the cardiac pads but heart size appears to be  grossly stable. Negative for a pneumothorax.     IMPRESSION:  Persistent left basilar chest densities are compatible with pleural  fluid and consolidation.    Dialysis catheter as described.      Electronically Signed    By: Markus Daft M.D.    On: 01/28/2014 10:49     Verified By: Burman Riis, M.D.,  Cardiology:    18-Aug-15 14:38, Echo Doppler  Echo Doppler   REASON FOR EXAM:      COMMENTS:       PROCEDURE: [facility] - ECHO DOPPLER COMPLETE(TRANSTHOR)  - Jan 28 2014  2:38PM     RESULT: Echocardiogram Report    Patient Name:   Blake Herrera Date of Exam: 01/28/2014  Medical Rec #:  [id number]        Custom1:  Date ofBirth:  05-29-1956     Height:       74.0  in  Patient Age:    59 years      Weight:       227.0 lb  Patient Gender: M             BSA:          2.29 m??    Indications: Atrial Fib  Sonographer:    Sherrie Sport RDCS  Referring Phys: Dustin Flock, H    Summary:   1. Atrial fibrillation noted.   2. Left ventricular ejection fraction, by visual estimation, is 50 to   55%.   3. Low normal global left ventricular systolic function.   4. Mild left ventricular hypertrophy.   5. Moderately dilated RV with mildly reduced systolic function.   6. Mildly dilated left atrium.   7. Moderately dilated right atrium.   8. Mild tricuspid regurgitation.   9. Mildly elevated pulmonary artery systolic pressure.  2D AND M-MODE MEASUREMENTS (normal ranges within parentheses):  Left Ventricle:          Normal  IVSd (2D):      1.06 cm (0.7-1.1)  LVPWd (  2D):     1.30 cm (0.7-1.1) Aorta/LA:                  Normal  LVIDd (2D):     4.35 cm (3.4-5.7) Aortic Root (2D): 2.90 cm (2.4-3.7)  LVIDs (2D):     3.41 cm Left Atrium (2D): 4.00 cm (1.9-4.0)  LV FS (2D):     21.5 %   (>25%)  LV EF (2D):     43.9 %   (>50%)                                    Right Ventricle:                                    RVd (2D):        7.34 cm  LV DIASTOLIC FUNCTION:  MV Peak E:1.04 m/s E/e' Ratio: 10.80                      Decel Time: 211 msec  SPECTRAL DOPPLER ANALYSIS (where applicable):  Mitral Valve:  MV P1/2 Time: 61.19 msec  MV Area, PHT: 3.60 cm??  Aortic Valve: AoV Max Vel: 1.33 m/s AoV Peak PG: 7.0 mmHg AoV Mean PG:  LVOT Vmax: 1.01 m/s LVOT VTI:  LVOT Diameter: 2.00 cm  AoV Area, Vmax: 2.39 cm?? AoV Area, VTI:  AoV Area, Vmn:  Tricuspid Valve and PA/RV Systolic Pressure: TR Max Velocity: 2.65 m/s RA   Pressure: 5 mmHg RVSP/PASP: 33.2 mmHg  Pulmonic Valve:  PV Max Velocity: 0.92 m/s PV Max PG: 3.4 mmHg PV Mean PG:    PHYSICIAN INTERPRETATION:  Left Ventricle: The left ventricular internal cavity size was normal.   Mild left ventricular hypertrophy.  Global LV systolic function was low   normal. Left ventricular ejection fraction, by visual estimation, is 50   to 55%.  Right Ventricle: The right ventricular size is moderately enlarged.   Global RV systolic function is mildly reduced.  Left Atrium: The left atrium is mildly dilated.  Right Atrium: The right atrium is moderately dilated.  Pericardium: There is no evidence of pericardial effusion.  Mitral Valve: The mitral valve is normal in structure. Trace mitral valve   regurgitation is seen.  Tricuspid Valve: The tricuspid valve is normal. Mild tricuspid   regurgitation is visualized. The tricuspid regurgitant velocity is 2.65   m/s, and with an assumed right atrial pressure of 5 mmHg, the estimated   right ventricular systolic pressure is mildly elevated at 33.2 mmHg.  Aortic Valve: The aortic valve is normal. The aortic valve is   structurally normal, with no evidence of sclerosis or stenosis. No   evidence of aortic valve regurgitation is seen.  Pulmonic Valve: The pulmonic valve is normal. Mild pulmonic valve   regurgitation.  Aorta: The aortic root and ascending aorta are structurally normal, with     no evidence of dilitation.    [id number] Ida Rogue MD  Electronically signed by [id number] Ida Rogue MD  Signature Date/Time: 01/28/2014/6:09:37 PM    *** Final ***    IMPRESSION: .        Verified By: Minna Merritts, M.D., MD    No Known Allergies:   Vital Signs/Nurse's Notes:  **Vital Signs.:   18-Aug-15 14:30  Vital Signs Type Admission  Temperature Temperature (F)  98.6  Celsius 37  Temperature Source oral  Pulse Pulse 99  Respirations Respirations 22  Systolic BP Systolic BP 270  Diastolic BP (mmHg) Diastolic BP (mmHg) 92  Mean BP 95  Pulse Ox % Pulse Ox % 95  Pulse Ox Activity Level  At rest  Oxygen Delivery Room Air/ 21 %    Impression 1.  Acute Respiratory Failure/AECOPD/? PNA:   Pt presented today with a 3 day h/o malaise and anorexia and fever  and dyspnea in dialysis this AM.  CXR shows persistent L basilar chest densities - pleural fluid/consolidation. S/P L sided thoracentesis @ [facility] in July for persistent loculated effusion. WBC 27K. Markedly L>R diminished breath sounds with bilat exp wheezing. --Abx/inhalers/steroids per IM.  2.  Afib RVR:   Pt has a h/o afib dating back to at least 01/2013, but it is unclear if this is paroxysmal or permanent.  Pt unsure. Per dialysis nurse, HR usually wnl when he presents for dialysis but was elevated today. Pt has noted elevated HR's/tachypalpitations since this weekend. Low normal EF --Agree with IV diltiazem infusion --Add IV heparin for now.  He is a poor long term anticoagulation candidate 2/2 need for dialysis and h/o GIB x 2. --With active wheezing, hold home dose of BB (Toprol XL 115m BID). --Check TSH. --Treat underlying respiratory illness.  3.  ? NSTEMI:   No chest pain.  CK/MB elevated with nl ratio. Troponin elevated @ 0.58 in setting of ESRD and creat of 4.97. --Adding heparin in setting of afib. --Follow troponin trend - ? demand ischemia in the setting of rapid afib. --Cont ASA, add statin. --Hold home BB 2/2 active wheezing. --At a minimum, would plan myoview to risk stratify once he recovers some from resp illness.  4.  ESRD:   Dialysis per nephrology.  5.  Tob Abuse:   Cessation advised.   Electronic Signatures: BRogelia Mire(NP)  (Signed 18-Aug-15 13:41)  Authored: General Aspect/Present Illness, History and Physical Exam, Review of System, Family & Social History, Home Medications, Labs, EKG , Radiology, Allergies, Impression/Plan GIda Rogue(MD)  (Signed 18-Aug-15 18:26)  Authored: General Aspect/Present Illness, History and Physical Exam, Review of System, Health Issues, Labs, EKG , Radiology, Vital Signs/Nurse's Notes, Impression/Plan  Co-Signer: General Aspect/Present Illness, History and Physical Exam, Review of System, Family & Social  History, Home Medications, Labs, EKG , Radiology, Allergies, Impression/Plan   Last Updated: 18-Aug-15 18:26 by GIda Rogue(MD)

## 2014-10-04 NOTE — Op Note (Signed)
PATIENT NAME:  Blake Herrera, Blake Herrera MR#:  [id number] DATE OF BIRTH:  December 06, 1955  DATE OF PROCEDURE:  01/31/2014  PREOPERATIVE DIAGNOSES:  1. Catheter-related sepsis.  2. End-stage renal disease.   POSTOPERATIVE DIAGNOSES: 1. Catheter-related sepsis. 2. Endstage renal disease.  PROCEDURE PERFORMED: Removal of cuffed tunneled dialysis catheter at the bedside.   SURGEON: Renford DillsGregory G Schnier, MD.   DESCRIPTION OF PROCEDURE: The patient is supine in his hospital bed. The left neck and chest wall are prepped and draped in a sterile fashion. Significant portion of the cuff is exposed already and the remaining subcutaneous adhesions are dissected free with blunt dissection. The catheter is removed. Tips are transected, placed in a sterile specimen cup for culture.  After approximately 30 minutes of pressure, the patient was still oozing from the track, and therefore, an 0 silk was used to create a loop securing the tract to prevent further bleeding. The patient tolerated the procedure well and there were no immediate complications.    ____________________________ Renford DillsGregory G. Schnier, MD ggs:ls D: 01/31/2014 18:24:07 ET T: 01/31/2014 19:46:55 ET JOB#: [id number]  cc: Renford DillsGregory G. Schnier, MD, <Dictator> Renford DillsGREGORY G SCHNIER MD ELECTRONICALLY SIGNED 02/18/2014 22:39

## 2014-10-04 NOTE — Consult Note (Signed)
Chief Complaint:  Subjective/Chief Complaint "Feels great."  Has not been out of bed much.  HR low at times, has not been getting diltiazem and HR is in the 50s-90s.   VITAL SIGNS/ANCILLARY NOTES: **Vital Signs.:   23-Aug-15 04:48  Vital Signs Type Routine  Temperature Temperature (F) 97.4  Celsius 36.3  Temperature Source oral  Pulse Pulse 71  Respirations Respirations 19  Systolic BP Systolic BP 093  Diastolic BP (mmHg) Diastolic BP (mmHg) 91  Mean BP 111  Pulse Ox % Pulse Ox % 99  Pulse Ox Activity Level  At rest  Oxygen Delivery Room Air/ 21 %   Brief Assessment:  GEN no acute distress   Cardiac Irregular  no murmur  -- LE edema  --Gallop  JVP 8 cm.   Respiratory normal resp effort  Decreased breath sounds left base.   Gastrointestinal details normal Soft  Nontender  Nondistended   EXTR negative cyanosis/clubbing   Lab Results: Routine Chem:  23-Aug-15 05:49   Glucose, Serum  120  BUN  88  Creatinine (comp)  8.82  Sodium, Serum  135  Potassium, Serum 4.7  Chloride, Serum  96  CO2, Serum 27  Calcium (Total), Serum  7.4  Anion Gap 12  Osmolality (calc) 298  eGFR (African American)  7  eGFR (Non-African American)  6 (eGFR values <28m/min/1.73 m2 may be an indication of chronic kidney disease (CKD). Calculated eGFR is useful in patients with stable renal function. The eGFR calculation will not be reliable in acutely ill patients when serum creatinine is changing rapidly. It is not useful in  patients on dialysis. The eGFR calculation may not be applicable to patients at the low and high extremes of body sizes, pregnant women, and vegetarians.)  Routine Hem:  23-Aug-15 05:49   WBC (CBC)  14.6  RBC (CBC)  3.47  Hemoglobin (CBC)  10.5  Hematocrit (CBC)  32.9  Platelet Count (CBC) 159 (Result(s) reported on 02 Feb 2014 at 07:06AM.)  MCV 95  MCH 30.3  MCHC  31.9  RDW  14.8  Segmented Neutrophils 81  Lymphocytes 10  Monocytes 8  Metamyelocyte 1  Diff  Comment 1 PLTS VARIED IN SIZE  Diff Comment 2 RBCs APPEAR NORMAL  Result(s) reported on 02 Feb 2014 at 07:06AM.   Assessment/Plan:  Assessment/Plan:  Assessment 1.  Acute Respiratory Failure/AECOPD/LLL PNA:   Pt presented with a 3 day h/o malaise and anorexia and fever and dyspnea in dialysis on the morning of admission. Positive blood cultures, MSSA.  Suspect LLL PNA. On antibiotics. Improving.  2.  Afib RVR:   Likely was driven by sepsis. Not on rate-control meds, HR in 50s-90s. Pt has a h/o afib dating back to at least 01/2013, but it is unclear if this is paroxysmal or permanent.  Pt unsure.  Suspect he has permanent afib and rate rose in setting of acute resp failure.  He is a poor long term anticoagulation candidate 2/2 need for dialysis and h/o GIB x 2. - Cont ASA. - Ordered for diltiazem 30 mg po q6 but looks like meds are being held due to mild bradycardia.  May not need rate control med at this time.  3.  Elevated troponin:  No chest pain.  Echo with EF 50-55%, no rwma. Mild troponin elevation with flat trend in setting of ESRD.  Possible demand ischemia in the setting of rapid afib. - Cont ASA, statin. - Will do Lexiscan cardiolite tomorrow.  4.  ESRD:  per nephrology.  5.  Tob Abuse:  Cessation advised.  He says that he has quit.   Electronic Signatures: Larey Dresser (MD)  (Signed 23-Aug-15 09:03)  Authored: Chief Complaint, VITAL SIGNS/ANCILLARY NOTES, Brief Assessment, Lab Results, Assessment/Plan   Last Updated: 23-Aug-15 09:03 by Larey Dresser (MD)

## 2014-10-04 NOTE — Consult Note (Signed)
Present Illness the patient presented to the hospital with multile medical problems.  Subsequently he has had positive blood cultures for Stap aureus.  He has a left IJ permacath and it was noted the cuff is partially exposed.  He has had some fever and mild shaking chills but no rigors.  I am asked to evaluate  PAST MEDICAL HISTORY:  1.  Significant for end-stage renal disease on dialysis Tuesday, Thursday, and Saturday.  2. Questionable history of atrial fibrillation. 3.  History of COPD.   4.  BPH.   Home Medications: Medication Instructions Status  Flomax 0.4 mg oral capsule 1 cap(s) orally once a day (at bedtime) Active  calcium acetate 667 mg oral tablet 2 tab(s) orally 3 times a day Active  Symbicort 160 mcg-4.5 mcg/inh inhalation aerosol 2 puff(s) inhaled 2 times a day Active  Spiriva 18 mcg inhalation capsule 1 each inhaled once a day Active  metoprolol succinate 100 mg oral tablet, extended release 1 tab(s) orally 2 times a day Active  Lasix 80 mg oral tablet 1 tab(s) orally 2 times a day Active  Proventil CFC free 90 mcg/inh inhalation aerosol 2 puff(s) inhaled every 6 hours, As Needed Active  Renagel 800 mg oral tablet 3 tab(s) orally 3 times a day and 2 with snacks Active    No Known Allergies:   Case History:  Family History Non-Contributory   Social History negative tobacco, negative ETOH, negative Illicit drugs   Review of Systems:  Fever/Chills Yes   Cough No   Sputum No   Abdominal Pain No   Diarrhea No   Constipation No   Nausea/Vomiting No   SOB/DOE No   Chest Pain No   Dysuria end stage renal disease   Physical Exam:  GEN well developed, well nourished, no acute distress   HEENT hearing intact to voice, moist oral mucosa   NECK supple  trachea midline   RESP normal resp effort  no use of accessory muscles   CARD regular rate  no JVD   VASCULAR ACCESS Dialysis catheter present  cuff exposed   ABD denies tenderness  soft   EXTR  negative cyanosis/clubbing, negative edema   SKIN normal to palpation, No rashes   NEURO follows commands, motor/sensory function intact   PSYCH alert, A+O to time, place, person   Nursing/Ancillary Notes: **Vital Signs.:   21-Aug-15 11:30  Vital Signs Type Routine  Temperature Temperature (F) 97.5  Celsius 36.3  Temperature Source oral  Pulse Pulse 73  Respirations Respirations 20  Systolic BP Systolic BP 283  Diastolic BP (mmHg) Diastolic BP (mmHg) 72  Mean BP 86  Pulse Ox % Pulse Ox % 93  Pulse Ox Activity Level  At rest  Oxygen Delivery Room Air/ 21 %   Routine Micro:  21-Aug-15 14:42   Micro Text Report BLOOD CULTURE   COMMENT                   NO GROWTH IN 8-12 HOURS   ANTIBIOTIC                       Micro Text Report BLOOD CULTURE   COMMENT                   NO GROWTH IN 8-12 HOURS   ANTIBIOTIC                       Culture Comment NO GROWTH  IN 8-12 HOURS  Result(s) reported on 31 Jan 2014 at 11:00PM.  Culture Comment NO GROWTH IN 8-12 HOURS  Result(s) reported on 31 Jan 2014 at 11:00PM.    17:00   Micro Text Report MISC. AEROBIC CULTURE   COMMENT                   NO GROWTH IN 8-12 HOURS   ANTIBIOTIC                       Specimen Source IJ [facility]  Culture Comment NO GROWTH IN 8-12 HOURS  Result(s) reported on 01 Feb 2014 at 11:43AM.  Routine Chem:  21-Aug-15 04:40   Glucose, Serum  135  BUN  31  Creatinine (comp)  5.32  Sodium, Serum 138  Potassium, Serum 4.0  Chloride, Serum  96  CO2, Serum 32  Calcium (Total), Serum  7.8  Anion Gap 10  Osmolality (calc) 284  eGFR (African American)  13  eGFR (Non-African American)  11 (eGFR values <60m/min/1.73 m2 may be an indication of chronic kidney disease (CKD). Calculated eGFR is useful in patients with stable renal function. The eGFR calculation will not be reliable in acutely ill patients when serum creatinine is changing rapidly. It is not useful in  patients on dialysis. The eGFR calculation  may not be applicable to patients at the low and high extremes of body sizes, pregnant women, and vegetarians.)  Result Comment CBC - SMEAR SCANNED  Result(s) reported on 31 Jan 2014 at 07:59AM.  Routine Hem:  21-Aug-15 04:40   WBC (CBC)  12.0  RBC (CBC)  3.35  Hemoglobin (CBC)  10.4  Hematocrit (CBC)  31.7  Platelet Count (CBC)  127  MCV 95  MCH 31.1  MCHC 32.8  RDW  14.9  Neutrophil % 84.1  Lymphocyte % 8.2  Monocyte % 7.6  Eosinophil % 0.0  Basophil % 0.1  Neutrophil #  10.1  Lymphocyte # 1.0  Monocyte # 0.9  Eosinophil # 0.0  Basophil # 0.0    Impression 1) Infected left IJ permacath       I agree with ID and Nephrology cath should be remvoed       tip will be cultured 2) a-fib with RVR:        cardiology on consult       not a good candidate for anticaogulation, continue with ASA only. 3) Methacillin Sensitive Staph aureus bacteremia        multiple blood cultures are positive       ID on consult 4) End Stage Renal Disease on Hemodialysis       nephrology on consult  Patient with HD T, TH, S       will hold HD for several dayys once cath removed 5) BPH       continue flowmax   nicotine addiction  - nicoderm patch possible discharge in 1-2 days depending on ID input Heparin subcutaneous home Full Code no, 35 min patient, care manager, nursing, dr fitgerald yes   Electronic Signatures: SHortencia Pilar(MD)  (Signed 22-Aug-15 13:35)  Authored: General Aspect/Present Illness, Home Medications, Allergies, History and Physical Exam, Vital Signs, Labs, Impression/Plan   Last Updated: 22-Aug-15 13:35 by SHortencia Pilar(MD)

## 2014-10-04 NOTE — Discharge Summary (Signed)
PATIENT NAME:  Blake Herrera, [name]:  [id number] DATE OF BIRTH:  1956/04/16  DATE OF ADMISSION:  01/28/2014 DATE OF DISCHARGE:  02/05/2014   PRESENTING COMPLAINT: Fevers and not feeling well.   DISCHARGE DIAGNOSES:  1.  Methicillin sensitive Staphylococcus aureus septicemia secondary to HD catheter infection, status post removal and placement of new permanent catheter. 2.  Atrial fibrillation with rapid ventricular response.   3.  Acute respiratory failure with hypoxia and pneumonia, completed treatment.  4.  End-stage renal disease, on hemodialysis.  5.  Benign prostatic hypertrophy.  6.  Elevated enzymes due to supply demand ischemia from sepsis and atrial fibrillation.  7.  Nicotine addiction.   CONDITION ON DISCHARGE: Fair.   Vitals stable.   MEDICATIONS AT DISCHARGE:   1.  IV Ancef to be given at hemodialysis to complete treatment for septicemia. Orders have been given by Dr. Thedore MinsSingh at the Dialysis Unit.  2.  Renagel 800 mg 3 tablets 3 times a day and 2 tablets with snacks.  3.  Proventil 2 puffs every 6 hours as needed.  4.  Lasix 80 mg b.i.d.  5.  Spiriva 18 mcg inhalation daily.  6.  Symbicort 160/4.5 mcg 2 puffs 2 times a day.  7.  Calcium acetate 667, 2 tablets 3 times a day.  8.  Flomax 0.4 mg 1 capsule at bedtime.  9.  Aspirin 81 mg daily.  10. Cardizem CD 120 mg extended release p.o. daily.   The patient advised not to take his metoprolol.   [facility].   Resume hemodialysis as before.   BRIEF SUMMARY OF HOSPITAL COURSE:  Mr. Blake Herrera is a 59 year old African American gentleman with a past medical history of end-stage renal disease on hemodialysis, who comes in with:   1.  MSSA bacteremia/sepsis. The patient had four out of four bottles positive, likely due to his HD catheter infection.  The patient got the infected left chest HD catheter removed on August 21 by Dr. Gilda CreaseSchnier.  Catheter tip was positive for Staphylococcus  aureus.  He was continued on vancomycin, however, changed to Ancef at hemodialysis by Dr. Sampson GoonFitzgerald. The patient received thereafter repeat blood cultures which were negative and he received a right groin temporary catheter, got dialysis through it and prior to discharge he received a new right-sided PermCath which was placed by Dr. Wyn Quakerew.  The patient tolerated the procedure well.  He will resume his hemodialysis as an outpatient on his scheduled days.    2.  Acute hypoxic respiratory failure secondary to pneumonia. The patient received treatment for this pain. His saturations remained stable on room air.   3.  Atrial fibrillation with RVR. In the setting of sepsis and pneumonia, the patient was changed to p.o. Cardizem.  His p.o. metoprolol was discontinued and he is not a candidate for anticoagulation and was placed on aspirin only.   4.  Benign prostatic hypertrophy. Continue Flomax.   5.  Nicotine addiction. The patient was advised on smoking cessation.   Hospital stay otherwise remained stable.  The patient remained a FULL CODE.    TIME SPENT:  40 minutes.     ____________________________ Wylie HailSona A. Allena KatzPatel, MD sap:nr D: 02/07/2014 16:13:37 ET T: 02/07/2014 22:38:55 ET JOB#: [id number]  cc: Jordyn Doane A. Allena KatzPatel, MD, <Dictator> Renford DillsGregory G. Schnier, MD Annice NeedyJason S. Dew, MD Stann Mainlandavid P. Sampson GoonFitzgerald, MD  Willow OraSONA A Anneth Brunell MD ELECTRONICALLY SIGNED 02/18/2014 14:51

## 2014-10-04 NOTE — Op Note (Signed)
PATIENT NAME:  Blake Herrera, Blake Herrera MR#:  [id number] DATE OF BIRTH:  12-13-1955  DATE OF PROCEDURE:  02/03/2014  PREOPERATIVE DIAGNOSES:  1. End-stage renal disease.  2. Status post removal of infected PermCath.  3. Hypertension.   POSTOPERATIVE DIAGNOSES: 1. End-stage renal disease.  2. Status post removal of infected PermCath.  3. Hypertension.   PROCEDURES: 1. Ultrasound guidance for vascular access, right femoral vein.  2. Placement of a 30-cm long Trialysis-type dialysis catheter right femoral vein.   SURGEON: Dr. Wyn Quakerew.   ANESTHESIA: Local.   ESTIMATED BLOOD LOSS: Minimal.   INDICATION FOR PROCEDURE: A 59 year old gentleman with end-stage renal disease. His PermCath was removed less than 72 hours ago and had a positive catheter tip culture for Staphylococcus aureus. He will have a temporary dialysis catheter placed today and then removed and then a permanent catheter can be placed on the next day or 2. Risks and benefits were discussed. Informed consent was obtained.   DESCRIPTION OF PROCEDURE: The patient was laid flat in his floor bed and groins were sterilely prepped and draped and a sterile surgical field was created. The right femoral vein was visualized with ultrasound and found to be widely patent. It was then accessed under direct ultrasound guidance without guidance without difficulty with Seldinger needle. A J-wire was then placed. After skin nick and dilatation, the 30-cm long Trialysis-type dialysis catheter was placed over the wire and the wire was removed. All 3 lumens withdrew dark red nonpulsatile blood and flushed easily with sterile saline. It was secured to skin at 30 cm with 3 nylon sutures. Sterile dressing was placed.    ____________________________ Blake NeedyJason S. Galen Russman, MD jsd:lt D: 02/03/2014 15:30:43 ET T: 02/04/2014 02:51:59 ET JOB#: [id number]  cc: Blake NeedyJason S. Lynasia Meloche, MD, <Dictator> Blake NeedyJASON S Tianah Lonardo MD ELECTRONICALLY SIGNED 02/05/2014 10:35

## 2014-10-04 NOTE — Consult Note (Signed)
PATIENT NAME:  Blake Herrera, Blake Herrera MR#:  [id number] DATE OF BIRTH:  [birth year]  DATE OF CONSULTATION:  01/29/2014  REFERRING PHYSICIAN:  Shreyang H. Allena Katz, MD CONSULTING PHYSICIAN:  Lynx Goodrich Lizabeth Leyden, MD  REASON FOR CONSULTATION: Evaluation and management of end-stage renal disease.   HISTORY OF PRESENT ILLNESS: The patient is a very pleasant 59 year old African American male with past medical history of COPD, end-stage renal disease, started on hemodialysis in 2014, left internal jugular PermCath, history of duodenal ulcer with GI bleed, obstructive sleep apnea, anemia of chronic kidney disease, secondary hyperparathyroidism, pulmonary hypertension, depression, tobacco abuse, hypertension, history of loculated left pleural effusion in July 2015, gout, prior history of pancreatitis who presented to [facility] with complaints of shortness of breath. He normally dialyzes in [place] and became short of breath during dialysis. He only received 2 hours of dialysis therapy yesterday. He denied fevers or chills. He does have intermittent nonproductive cough, however. There was a left basilar opacity noted. The patient was felt to have possible pneumonia and was started on IV antibiotics with vancomycin and Zosyn. He was also felt to have a mild COPD flare. He also presented with atrial fibrillation with rapid ventricular response. This morning, he denies having any chest pains or palpitations, however.   PAST MEDICAL HISTORY: 1.  End-stage renal disease, on hemodialysis followed by [facility] nephrology in [place].  2.  History of severe COPD.   3.  History of GI bleed secondary to duodenal ulcer.  4.  Obstructive sleep apnea.  5.  Anemia of chronic kidney disease.  6.  Secondary hyperparathyroidism.  7.  Failed AVF x 2.  8.  Pulmonary hypertension.  9.  History of atrial fibrillation.  10.  Depression.  11.  Tobacco abuse.  12.  Hypertension.  13.  History of persistent loculated left pleural  effusion in July 2015.  14.  Gout.   PAST SURGICAL HISTORY:  1. Failed arteriovenous fistula x 2.  2.  Left internal jugular PermCath.   ALLERGIES: No known drug allergies.   CURRENT INPATIENT MEDICATIONS: Include:  1.  Heparin drip.  2.  Aspirin 81 mg daily.  3.  Lipitor 40 mg p.o. daily.  4.  Symbicort 160/4.5 two  puffs inhaled b.i.d.  5.  PhosLo 2 capsules p.o. t.i.d. with meals.  6.  Solu-Medrol 40 mg IV q. 8 hours.  7.  Nicotine patch 21 mg transdermal daily.  8.  Zofran 4 mg IV q. 4 hours p.r.n.  9.  Renvela 2400 mg p.o. t.i.d. with meals.  10.  Flomax 0.4 mg p.o. at bedtime.  11.  Tiotropium 1 capsule inhaled daily.  12.  Vancomycin 1 gram IV today.  13.  Azithromycin 500 mg IV every 24 hours.  14.  Diltiazem 60 mg p.o. every 6 hours.  15.  Zosyn 3.375 grams IV q. 12 hours.   SOCIAL HISTORY: The patient resides in [place] with his daughter. He used to work in [employer]. Has active tobacco abuse and continues to smoke 1 pack per day. Denies alcohol or illicit drug use.   FAMILY HISTORY: The patient's mother died secondary to cardiovascular disease. The patient's father died secondary to CVA. The patient's aunt is on dialysis.   REVIEW OF SYSTEMS:  CONSTITUTIONAL: Denies fevers, chills, weight loss.  EYES: Denies diplopia, blurry vision.  HEENT: Denies headaches, hearing loss. Denies epistaxis or sore throat.  CARDIOVASCULAR: Currently denies chest pain, palpitations. Does have history of atrial fibrillation and presented with this  on this admission.  RESPIRATORY: Endorses cough, as well as shortness of breath.  GASTROINTESTINAL: Denies nausea, vomiting, or bloody stools.  GENITOURINARY: Denies frequency, urgency, or dysuria.  MUSCULOSKELETAL: Denies joint pain, swelling, or redness.  INTEGUMENTARY: Denies skin rashes or lesions.  NEUROLOGIC: Denies focal extremity numbness, weakness, or tingling.  PSYCHIATRIC: Has history of depression.  ENDOCRINE: Denies  polyuria, polydipsia.  HEMATOLOGIC AND LYMPHATIC: Denies easy bruisability, bleeding, or swollen lymph nodes. Has history of anemia of chronic kidney disease.  ALLERGY AND IMMUNOLOGIC: Denies seasonal allergies or history of immunodeficiency.   PHYSICAL EXAMINATION: VITAL SIGNS: Temperature 97.7, pulse 76, respirations 22, blood pressure 101/75, pulse oximetry 95% on 2 liters.  GENERAL: Reveals a well-developed, well-nourished African American male who appears his stated age, currently in no acute distress.  HEENT: Normocephalic, atraumatic. Extraocular movements are intact. Pupils are equal, round, reactive to light. No scleral icterus. Conjunctivae are pink. No epistaxis noted. Gross hearing intact. Oral mucosa moist.  NECK: Supple and without JVD or lymphadenopathy. Left internal jugular PermCath noted.  LUNGS: Demonstrate bilateral wheezing with normal respiratory effort.  HEART: S1, S2. The patient noted to be irregular. No murmurs or rubs appreciated.  ABDOMEN: Soft, nontender, nondistended. Bowel sounds positive. No rebound or guarding. No gross organomegaly appreciated.  EXTREMITIES: No clubbing, cyanosis, or edema.  NEUROLOGIC: The patient is awake, alert, oriented to time, person, place. Strength is 5/5 in both upper and lower extremities.  SKIN: Warm and dry. No rashes noted.  MUSCULOSKELETAL: No joint redness, swelling, or tenderness appreciated.  GENITOURINARY: No suprapubic tenderness is noted at this time.  PSYCHIATRIC: The patient with appropriate affect and appears to have good insight into his current illness.   LABORATORY DATA: CBC shows WBC 14.2, hemoglobin 10, hematocrit 30, platelets 120,000. BMP shows sodium 132, potassium 4, chloride 96, CO2 of 25, BUN 27, creatinine 6.9, glucose 195. TSH 3.18. A 2-D echocardiogram shows EF of 50% to 55%, atrial fibrillation noted, mild LVH noted. Chest x-ray shows persistent left basilar chest densities compatible with pleural effusion and  consolidation. Blood cultures x 2 sets show gram-positive cocci.   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 Blood cultures showing gram-positive cocci x 2 sets thus far.  1.  End-stage renal disease, on hemodialysis. Patient only received 2 hours of dialysis treatment yesterday. We will proceed with hemodialysis treatment today with a goal ultrafiltration of 1.5 kg. We will plan for dialysis tomorrow as well.  2.  Sepsis. Blood cultures are showing gram-positive cocci. The dialysis catheter could be the source. He also has left basilar consolidation as well as effusion. Continue broad-spectrum antibiotics including vancomycin at this time.  3.  Anemia of chronic kidney disease. Hemoglobin currently 10.1. We will hold off on Epogen today, but will start this tomorrow.  4.  Secondary hyperparathyroidism. Check PTH, as well as phosphorus today. Continue PhosLo and Renvela as ordered for now.  5.  Left basilar consolidation and left pleural  effusion. [facility] He apparently had lymphocytic infiltrate at that point in time. Would consider pulmonary consultation for this.  6.  I would like to thank Dr. Auburn BilberryShreyang Patel for this kind  referral. Further plan as patient progresses.    ____________________________ Lennox PippinsMunsoor N. Capucine Tryon, MD mnl:at D: 01/29/2014 10:45:54 ET T: 01/29/2014 11:28:39 ET JOB#: [id number]  cc: Lennox PippinsMunsoor N. Lukisha Procida, MD, <Dictator> Lennox PippinsMUNSOOR N Shaquille Murdy MD ELECTRONICALLY SIGNED 02/27/2014 15:50

## 2014-10-04 NOTE — Op Note (Signed)
PATIENT NAME:  Blake Herrera, Blake Herrera MR#:  [id number] DATE OF BIRTH:  1956/06/02  DATE OF PROCEDURE:  03/12/2014  PREOPERATIVE DIAGNOSES:  1. 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  2. End-stage renal disease requiring hemodialysis.  3. Left arm pain.   POSTOPERATIVE DIAGNOSES:  1. 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  2. End-stage renal disease requiring hemodialysis.  3. Left arm pain.  4. Complication of vascular device with occlusion of previously placed stent, left arm.   PROCEDURE PERFORMED: 1. Arch aortogram.  2. Left upper extremity distal runoff, third order catheter placement.   SURGEON: Renford Dills, M.D.   SEDATION: Versed 4 mg plus fentanyl 100 mcg administered IV. Continuous ECG, pulse oximetry and cardiopulmonary monitoring was performed throughout the entire procedure by the interventional radiology nurse. Total sedation time is 1 hour.   ACCESS: A 6 French sheath, right common femoral artery.   FLUOROSCOPY TIME: 12.4 minutes.   CONTRAST USED: Isovue 70 mL.   INDICATIONS: Blake Herrera is a 59 year old gentleman who is now on hemodialysis secondary to end-stage renal disease. He is requiring an upper extremity access. Currently he is maintained with a right IJ cuffed tunneled dialysis catheter. On evaluation, he does note left arm pain and physical examination demonstrates nonpalpable brachial, radial and ulnar pulses. Noninvasive studies demonstrate diffuse disease in the midportion. Risks and benefits for angiography with the hope for intervention were reviewed. All questions were answered. The patient has agreed to proceed.   DESCRIPTION OF PROCEDURE: The patient is taken to special procedures and placed in the supine position. After adequate sedation is achieved, he is positioned supine with his left arm extended palm upward. Both groins are prepped and draped in a sterile fashion.   Appropriate timeout is called.    Ultrasound is placed in a sterile sleeve and the common femoral artery on the right side is identified. It is echolucent and pulsatile indicating patency. Image is recorded for the permanent record, and under real-time visualization, access is obtained with a micropuncture needle, microwire followed by micro sheath, J-wire followed by a 5 French sheath. A pigtail catheter and wire are then advanced into the ascending aorta and an LAO projection of the arch is obtained. After review of the images, the pigtail catheter is exchanged over a stiff-angled Glidewire for an H1 catheter, and the left subclavian is selected, and the catheter is advanced initially just beyond the origin of the vertebral, and after the first image, it is advanced down to the level of the axillary proximal brachial. Distal runoff is then obtained. There is a previously placed stent. There is long segment occlusion of the brachial in its midportion.   Heparin 3000 units is given. The stiff-angled Glidewire is reintroduced and the 5 [place] sheath is exchanged for a 90 cm Raabe sheath, which is advanced out so that the tip is in the proximal brachial artery. Using the Glidewire and a straight slip catheter, attempts at crossing this occlusion are made and multiple angles are obtained; however, it is not successful. The lesion is not crossed with entrance into the lumen of the distal brachial artery. Distal runoff is then obtained down to the hand. After review of these images, the sheath is pulled back into the external iliac on the right. Oblique view is obtained and a StarClose device deployed. There were no immediate complications.   INTERPRETATION: Arch demonstrates normal anatomy. It is somewhat steep, but otherwise type 1. [place] of the great vessels  were all widely patent. The subclavian and axillary arteries on the left are widely patent. In the midportion of the brachial artery, there is an occlusion. There is a previously placed  vascular stent. This was done at an outside institution. This is occluded. There was reconstitution of the distal brachial artery at the level of the antecubital fossa and 2 vessel runoff radial artery is demonstrated proximally for several centimeters, but then occludes and remains occluded throughout its course. Ulnar artery and interosseous artery are noted. Ulnar is of normal caliber and fills the hand quite nicely. There was no significant disease noted. It does fill the entire palmar arch in all 10 of the digital arteries.   SUMMARY: Occluded brachial artery in its midportion. The patient is certainly a suitable candidate for bypass. That saphenous vein will be mapped, and, subsequently, he could undergo bypass with construction of a brachial axillary AV graft.   ____________________________ Renford DillsGregory G. Schnier, MD ggs:JT D: 03/12/2014 09:42:54 ET T: 03/12/2014 12:09:13 ET JOB#: [id number]  cc: Renford DillsGregory G. Schnier, MD, <Dictator> Renford DillsGREGORY G SCHNIER MD ELECTRONICALLY SIGNED 03/31/2014 20:45

## 2014-10-04 NOTE — Consult Note (Signed)
PATIENT NAME:  Blake Herrera, Blake Herrera MR#:  [id number] DATE OF BIRTH:  11-Mar-1956  DATE OF CONSULTATION:  01/31/2014  REFERRING PHYSICIAN:  Delfino LovettVipul Shah, MD. CONSULTING PHYSICIAN:  Stann Mainlandavid P. Sampson GoonFitzgerald, MD  REASON FOR CONSULT: Bacteremia with methicillin-sensitive Staphylococcus aureus.   HISTORY OF PRESENT ILLNESS: This is a 59 year old gentleman with history of end-stage renal disease for over a year, COPD, atrial fibrillation, and BPH, who was at dialysis on August 18 when he was noted to have a fever of 100.8, shortness of breath, and hypoxia. He was brought to the ER and was found to have heart rate in the 140s with atrial fibrillation with rapid ventricular response. Since admission, the patient has been found to have methicillin-sensitive Staphylococcus aureus bacteremia in 2/2 bottles. He has defervesced and is clinically stable. He is off of oxygen. We are consulted for further recommendations.   PAST MEDICAL HISTORY:  1.  End-stage renal disease.  2.  Possible history of atrial fibrillation.  3.  Chronic obstructive pulmonary disease.  4.  Benign prostatic hypertrophy.   PAST SURGICAL HISTORY: Placement of prior AV fistulas x 2, nonfunctioning.   ALLERGIES: NO KNOWN DRUG ALLERGIES.   SOCIAL HISTORY: The patient continues to smoke a pack per day. No alcohol or drug use.   FAMILY HISTORY: Positive for hypertension.   ANTIBIOTICS SINCE ADMISSION: Include azithromycin, vancomycin, and Zosyn. He has also been on methylprednisolone.   REVIEW OF SYSTEMS:  Eleven systems reviewed and negative except as per HPI.   PHYSICAL EXAMINATION:  VITAL SIGNS: Temperature 99.3, pulse 71, blood pressure 87/57, respirations 19, saturations 98% on room air.  GENERAL: He is thin, pleasant, in no acute distress.  HEENT: Pupils equal, round, and reactive to light and accommodation. Extraocular movements are intact. Sclerae are anicteric. Oropharynx is clear.  NECK: Supple.  CHEST:  In his left chest, he has a  hemodialysis catheter that has no erythema or redness or tenderness at the site.  HEART: Regular.  LUNGS: Decreased breath sounds at the left base with some wheeze.  ABDOMEN: Obese, soft, nontender.  EXTREMITIES: No clubbing, cyanosis or edema. In his right upper extremity, he has nonfunctioning forearm graft. Left upper extremity, he also has a nonfunctioning graft.  NEUROLOGIC: He is alert and oriented x 3. Grossly nonfocal neuro exam.   LABORATORY DATA: Blood cultures from August 18, two of two growing MSSA, pansensitive. White blood count on admission was 27,000; currently, it is 12,000. Hemoglobin 10.4, platelets 127,000. Echocardiogram shows atrial fibrillation, EF 50%-55%. No evidence of endocarditis. Renal function is consistent with end-stage renal disease. Troponins were positive up to 0.7.   IMAGING: He has a chest x-ray which showed persistent left basilar chest densities compatible with pleural effusion and consolidation. This is present since 01/14/2014.   IMPRESSION: A 59 year old gentleman with history of end-stage renal disease on hemodialysis through a left chest catheter that has been present for 2-3 months. He presents with sepsis and possible left lower lobe pneumonia. He was also in atrial fibrillation with rapid ventricular response and had a white count of 27,000. He is clinically stabilized, but cultures are growing methicillin-sensitive staphylococcus aureus. This is likely associated as he has had no other sources of infection with no recent skin lesions, boils, or other issues.   RECOMMENDATIONS:  1.  Removal of the catheter and repeat blood cultures. If the cultures remain negative, he could have replacement of the catheter and a 2-week course of antibiotics that can be given at dialysis. He could  be treated with Ancef with renal dosing.   2.  Echocardiogram is negative for any vegetations. I would defer on doing a full TEE unless he worsens.   3.  Consider CT of the  chest given the persistent left basilar opacities. This can be done without contrast when he is more stable.   Thank you for the consult. I will be glad to follow with you.    ____________________________ Stann Mainland. Sampson Goon, MD dpf:TT D: 01/31/2014 14:08:29 ET T: 01/31/2014 14:50:40 ET JOB#: [id number]  cc: Stann Mainland. Sampson Goon, MD, <Dictator> Lis Savitt Sampson Goon MD ELECTRONICALLY SIGNED 01/31/2014 22:29

## 2014-10-04 NOTE — Consult Note (Signed)
Chief Complaint:  Subjective/Chief Complaint Feels good this morning.  No dyspnea.  BP stable, HR in 90s.   VITAL SIGNS/ANCILLARY NOTES: **Vital Signs.:   22-Aug-15 05:00  Vital Signs Type Routine  Temperature Temperature (F) 97.5  Celsius 36.3  Temperature Source oral  Pulse Pulse 83  Respirations Respirations 29  Systolic BP Systolic BP 397  Diastolic BP (mmHg) Diastolic BP (mmHg) 94  Mean BP 107  Pulse Ox % Pulse Ox % 94  Pulse Ox Activity Level  At rest  Oxygen Delivery Room Air/ 21 %   Brief Assessment:  GEN no acute distress   Cardiac Irregular  no murmur  --Gallop  JVP 8 cm.   Respiratory normal resp effort  Decreased breath sounds left base.   Gastrointestinal details normal Soft  Nontender  Nondistended   EXTR negative cyanosis/clubbing   Lab Results: Routine Chem:  22-Aug-15 04:37   Glucose, Serum  139  BUN  58  Creatinine (comp)  7.47  Sodium, Serum  135  Potassium, Serum 4.5  Chloride, Serum  95  CO2, Serum 28  Calcium (Total), Serum  7.9  Anion Gap 12  Osmolality (calc) 289  eGFR (African American)  8  eGFR (Non-African American)  7 (eGFR values <29m/min/1.73 m2 may be an indication of chronic kidney disease (CKD). Calculated eGFR is useful in patients with stable renal function. The eGFR calculation will not be reliable in acutely ill patients when serum creatinine is changing rapidly. It is not useful in  patients on dialysis. The eGFR calculation may not be applicable to patients at the low and high extremes of body sizes, pregnant women, and vegetarians.)  Routine Hem:  22-Aug-15 04:37   WBC (CBC)  12.3  RBC (CBC)  3.34  Hemoglobin (CBC)  10.2  Hematocrit (CBC)  32.1  Platelet Count (CBC)  129 (Result(s) reported on 01 Feb 2014 at 05:52AM.)  MCV 96  MCH 30.6  MCHC  31.9  RDW  14.6  Bands 2  Segmented Neutrophils 72  Lymphocytes 18  Monocytes 6  Metamyelocyte 1  Myelocyte 1  NRBC 1  Diff Comment 1 ANISOCYTOSIS  Diff Comment 2  POIKILOCYTOSIS  Diff Comment 3 PLTS VARIED IN SIZE  Result(s) reported on 01 Feb 2014 at 05:52AM.   Assessment/Plan:  Assessment/Plan:  Assessment 1.  Acute Respiratory Failure/AECOPD/LLL PNA:   Pt presented with a 3 day h/o malaise and anorexia and fever and dyspnea in dialysis on the morning of admission. Positive blood cultures, MSSA.  Suspect LLL PNA. On antibiotics. Improving.  2.  Afib RVR:   Likely was driven by sepsis. Not on rate-control meds, HR in 90s. Pt has a h/o afib dating back to at least 01/2013, but it is unclear if this is paroxysmal or permanent.  Pt unsure.  Suspect he has permanent afib and rate rose in setting of acute resp failure.  He is a poor long term anticoagulation candidate 2/2 need for dialysis and h/o GIB x 2. - Cont ASA. - Can resume diltiazem 30 mg every 6 hours.  If HR not low on this regimen, can start diltiazem CD 120 mg daily tomorrow.  3.  Elevated troponin:  No chest pain.  Echo with EF 50-55%, no rwma. Mild troponin elevation with flat trend in setting of ESRD.  Possible demand ischemia in the setting of rapid afib. - Cont ASA, statin. - Will arrange for Cardiolite, can do Monday or as outpatient if he goes home over the weekend.  4.  ESRD:  per nephrology. 5.  Tob Abuse:  Cessation advised.  He says that he has quit.   Electronic Signatures: Larey Dresser (MD)  (Signed 22-Aug-15 09:41)  Authored: Chief Complaint, VITAL SIGNS/ANCILLARY NOTES, Brief Assessment, Lab Results, Assessment/Plan   Last Updated: 22-Aug-15 09:41 by Larey Dresser (MD)

## 2014-10-04 NOTE — H&P (Signed)
PATIENT NAME:  Blake Herrera, Blake Herrera MR#:  [id number] DATE OF BIRTH:  Nov 05, 1955  DATE OF ADMISSION:  01/28/2014  PRIMARY CARE PROVIDER[facility]  [facility].  EMERGENCY DEPARTMENT REFERRING PHYSICIAN: Dr. Shaune PollackLord.   CHIEF COMPLAINT: Shortness of breath,  atrial fibrillation with RVR.   HISTORY OF PRESENT ILLNESS: The patient is a 59 year old African American male who has a history of end-stage renal disease, COPD, atrial fibrillation, and BPH, who was seen here about a month ago with shortness of breath. At that time got dialyzed and was discharged from the Emergency Room, who went to get his dialysis today and was noted to have a temperature of 100.8. The patient was very short of breath and hypoxic. He was placed on a BiPAP and subsequently sent here. The patient was in the ED, continues to be on BiPAP. His heart rate is noted to be in the 140s. He was in atrial fibrillation with RVR. He was started on a Cardizem drip. The patient otherwise denies any chest pain. Denies any nausea, vomiting, or diarrhea. Denies any fevers that he can recall. He is denying any headaches or neck stiffness.   PAST MEDICAL HISTORY:  1.  Significant for end-stage renal disease on dialysis Tuesday, Thursday, and Saturday.  2. Questionable history of atrial fibrillation. 3.  History of COPD.   4.  BPH.   PAST SURGICAL HISTORY: (Dictation Anomaly)<<MISSING TEXT>>  access.   ALLERGIES: None.   CURRENT MEDICATIONS AT HOME:  Symbicort 160/4.5 two puffs b.i.d., Spiriva 18 mcg daily, Renagel 800 mg 3 tabs t.i.d., Proventil 2 puffs q. 6 p.r.n., metoprolol succinate 100 mg 1 tab p.o. b.i.d., Lasix 80 mg 1 tablet p.o. b.i.d., Flomax 0.4 daily, calcium acetate 667 two t.i.d.   SOCIAL HISTORY: Smokes half pack per day. No alcohol or drug use.   FAMILY HISTORY: Positive for hypertension.    SOCIAL HISTORY: Continues to smoke half pack per day. No alcohol or drug use.   REVIEW OF SYSTEMS: CONSTITUTIONAL: The patient denies any fevers.  Complains of fatigue, weakness. No pain. No weight loss. No weight gain.  EYES: No blurred or double vision. No redness. No inflammation.  ENT: No tinnitus, ear pain. No hearing loss. No seasonal or year-round allergies. No epistaxis. No nasal discharge. No difficulty swallowing.  RESPIRATORY: Denies any cough, wheezing, or hemoptysis. Has COPD.   CARDIOVASCULAR: Denies any chest pain, orthopnea, edema. Has a history of atrial fibrillation.  GASTROINTESTINAL: No nausea, vomiting, diarrhea. No abdominal pain. No hematemesis.  GENITOURINARY: Denies any dysuria, hematuria, renal calculus, or frequency.  ENDOCRINE: Denies any polyuria, nocturia, or thyroid problems.  HEMATOLOGIC AND LYMPHATIC: Denies anemia, easy bruisability, or bleeding.  SKIN: No acne. No rash.  MUSCULOSKELETAL: Denies any pain in the neck, back, or shoulder.  NEUROLOGIC: No numbness, CVA, TIA, or seizures.  PSYCHIATRIC: No anxiety, insomnia, or ADD.   PHYSICAL EXAMINATION: VITAL SIGNS: Temperature 98.5, pulse 144, respirations 27, blood pressure 102/85.  GENERAL: The patient is a critically ill-appearing male with some accessory muscle usage.  HEENT: Head atraumatic, normocephalic. Pupils equally round, reactive to light and accommodation. There is no conjunctival pallor. No scleral icterus. Extraocular movements intact. Nasal exam shows no nasal lesions or drainage. Ear exam shows no drainage or external lesions. Mouth is moist without any exudate.  NECK: Supple and symmetric. No masses. Thyroid midline.  RESPIRATORY: He has bilateral wheezing throughout both lungs. Accessory muscle usage.  CARDIOVASCULAR: Tachycardic, irregularly irregular heart rhythm.  GASTROINTESTINAL: The patient is nontender, nondistended. No hepatosplenomegaly.  Positive bowel sounds x 4. No guarding or rebound.  GENITOURINARY: Deferred.  MUSCULOSKELETAL: There is no erythema or swelling.  SKIN: There is no rash.  LYMPHATICS: No lymph nodes palpable.   VASCULAR: Good DP, PT pulses.  NEUROLOGIC: Cranial nerves II through XII grossly intact. No focal deficits.  PSYCHIATRIC: Not anxious or depressed.   DIAGNOSTIC DATA: Evaluation in the ED, PA and lateral chest x-ray shows left basilar chest densities compatible with pleural fluid and consolidation. ABG with pH of 7.34, pCO2 of 48, pO2 of 197. Lactic acid 1.6. CPK 540, CK-MB 5.9. WBC 27.1, hemoglobin 11.6, platelet count is 155,000. Glucose 71, BUN 17, creatinine 4.77, sodium 139, potassium 3.8, chloride 103, CO2 of 24, calcium 6.6, albumin 2.5. Troponin 0.58.   ASSESSMENT AND PLAN: The patient is a 59 year old African American male who presents with acute respiratory failure noted to be in atrial fibrillation with rapid ventricular response.  1.  Acute respiratory failure due to pneumonia, chronic obstructive pulmonary disease flare. At this time, will treat him with IV antibiotics (Dictation Anomaly)<<MISSING TEXT>> for possible healthcare associated pneumonia in light of the patient being on dialysis. Also for chronic obstructive pulmonary disease exacerbation, I will place him on Xopenex. In light of his heart rate being elevated, IV Solu-Medrol. A pulmonary consult will be obtained. We will repeat a chest x-ray and ABG in the morning.  2.  Atrial fibrillation with rapid ventricular response, on Cardizem drip. I will try IV Digoxin x 1 if no response, may need amiodarone drip. I have discussed the case with Dr. Mariah Milling who will come and see the patient. Will obtain echocardiogram of the heart. Obtain records from [facility].  3.  End-stage renal disease. Nephrology has been consulted. I spoke to Dr. Cherylann Ratel.  4.  BPH. Will continue Flomax.  5.  Nicotine addiction. Smoking cessation given to the patient, 4 minutes spent. Nicotine patch started. I recommended the patient strongly stop smoking. Smoking cessation time spent on this patient 4 minutes.   6.  Elevated troponin in the setting of end-stage renal  disease, likely due to poor renal clearance.  7.  Possible demand ischemia. Will follow his cardiac enzymes. Cardiology has been consulted.  I will place him on aspirin 81 mg 1 tab p.o. daily.    ____________________________ Lacie Scotts Allena Katz, MD shp:at D: 01/28/2014 13:38:31 ET T: 01/28/2014 14:22:26 ET JOB#: [id number]  cc: Analysa Nutting H. Allena Katz, MD, <Dictator>

## 2014-10-04 NOTE — Op Note (Signed)
PATIENT NAME:  Blake Herrera, Blake Herrera MR#:  [id number] DATE OF BIRTH:  [birth year]  DATE OF PROCEDURE:  02/05/2014  PREOPERATIVE DIAGNOSIS: End-stage renal disease requiring hemodialysis.   POSTOPERATIVE DIAGNOSIS: End-stage renal disease requiring hemodialysis.   PROCEDURE PERFORMED: Insertion of right internal jugular cuffed tunneled dialysis catheter with fluoroscopic and ultrasound guidance.   PROCEDURE PERFORMED BY: Renford DillsGregory G. Dorota Heinrichs, M.D.   DESCRIPTION OF PROCEDURE: The patient is brought to the cardiac catheterization suite, placed in the supine position. After adequate sedation is achieved, his right neck and chest wall are prepped and draped in a sterile fashion. Appropriate timeout is called.   With ultrasound placed in a sterile sleeve, jugular vein is identified. It is echolucent and compressible indicating patency. Image is recorded for the permanent record. Lidocaine 1% lidocaine is infiltrated in the soft tissues at the base of the neck and on the chest wall, and under real-time visualization, a microneedle is inserted into the jugular vein. Microwire followed by micro sheath. J-wire is advanced under fluoroscopic guidance into the inferior vena cava. A small counterincision is made with a n #11 blade scalpel and a pocket fashioned with blunt using a hemostat. Dilators are then passed over the wire and the dilator and peel-away sheath is inserted.   A 19 cm tip to cuff Cannon catheter is then advanced into the peel-away sheath after the dilator and wire were removed. The peel-away sheath is removed under fluoroscopy. The catheter is positioned with its tips at the atriocaval junction and then approximated to the chest wall. Exit site is selected. A small incision is made at the exit site, and the tunneling device is passed subcutaneously. The catheter is then pulled through the subcutaneous tunnel. Under fluoroscopy, the tips are positioned appropriately and then the catheter is transected. The  hub is connected. Both lumens are aspirated and then flushed with sterile saline. After a final check on the catheter for position and smooth contour, 5000 units of heparin per lumen is instilled. The catheter is secured to the chest wall with 0 silk and then the neck counterincision is closed with 4-0 Monocryl subcuticular and Dermabond. Sterile dressing is applied.   The patient tolerated the procedure well and there were no immediate complications.    ____________________________ Renford DillsGregory G. Kesleigh Morson, MD ggs:ST D: 02/05/2014 18:11:48 ET T: 02/05/2014 23:33:37 ET JOB#: [id number]  cc: Renford DillsGregory G. Exodus Kutzer, MD, <Dictator> Renford DillsGREGORY G Hennessy Bartel MD ELECTRONICALLY SIGNED 02/18/2014 22:39

## 2014-10-06 LAB — SURGICAL PATHOLOGY

## 2014-10-12 NOTE — Consult Note (Signed)
PATIENT NAME:  Blake Herrera, Blake Herrera MR#:  [id number] DATE OF BIRTH:  18-Mar-1956  DATE OF CONSULTATION:  07/25/2014  REFERRING PHYSICIAN:   CONSULTING PHYSICIAN:  Shaune Pollack, MD  PRIMARY CARE PHYSICIAN: Nonlocal.   REFERRING PHYSICIAN: Dr. Gilda Crease  REASON FOR CONSULTATION: Medical management.  HISTORY OF PRESENT ILLNESS: The patient is 59 year old African American male with a history of ESRD and COPD and A-fib who came to the hospital due to AV fistula blockage. The patient underwent procedure today and after procedure Dr. Gilda Crease requested medical management. I saw the patient in procedure recovery room. The patient has no complaints. The patient denies any chest pain, palpitations, orthopnea, or nocturnal dyspnea. No cough, sputum, or shortness of breath. Denies any other symptoms.   PAST MEDICAL HISTORY:  1.  ESRD on hemodialysis Tuesday, Thursday and Saturday. 2.  COPD. 3.  Chronic A-fib, not on anticoagulation due to previous GI bleeding.  4.  Secondary hyperparathyroidism.   SOCIAL HISTORY: Smokes 1/2 pack a day for 30 years. Denies any alcohol drinking or illicit drugs.   FAMILY HISTORY: Mother died from old age. Father died from stroke.   ALLERGIES: None.   HOME MEDICATIONS: Symbicort 160 mcg/4.5 mcg inhalation 2 puffs twice a day, Spiriva 18 mcg inhaled one each once a day, Renagel 800 mg 3 tablets 3 times a day, Proventil CFC free 90 mcg 2 puffs every 6 hours p.r.n., Lopressor extended release 50 mg p.o. once a day, Lasix 80 mg p.o. b.i.d., Flomax 0.4 mg p.o. at bedtime, Cardizem CD 120 mg p.o. daily at bedtime, calcium acetate 667 mg p.o. 2 tablets t.i.d.   REVIEW OF SYSTEMS: CONSTITUTIONAL: The patient denies any fever or chills. No headache or dizziness or weakness.  EYES: No double vision or blurred vision.  EARS, NOSE, THROAT: No postnasal drip, slurred speech or dysphagia.  CARDIOVASCULAR: No chest pain, palpitation, orthopnea, or nocturnal dyspnea. No leg edema.  PULMONARY:  No cough, sputum, shortness of breath, or hematemesis.  GASTROINTESTINAL: No abdominal pain, nausea, vomiting, diarrhea. No melena or bloody stool.  GENITOURINARY: No dysuria, hematuria, or incontinence.  SKIN: No rash or jaundice.  NEUROLOGY: No syncope, loss of consciousness, or seizure.  ENDOCRINE: No polyuria, polydipsia, heat or cold intolerance.  HEMATOLOGY: No easy bruising or bleeding.   PHYSICAL EXAMINATION:  VITAL SIGNS: Blood pressure 106/71, pulse 74, O2 saturation 100% on oxygen 2 liters by nasal cannula.  GENERAL: The patient is alert, awake and oriented, in no acute distress.  HEENT: Pupils round, equal and reactive to light and accommodation. Moist oral mucosa. Clear oropharynx.  NECK: Supple. No JVD or carotid bruits. No lymphadenopathy. No thyromegaly. CARDIOVASCULAR: S1 and S2, irregular rate and rhythm. No murmurs or gallops. PULMONARY: Bilateral air entry. No wheezing or rales. No use of accessory muscles to breathe. ABDOMEN: Soft. No distention or tenderness. No organomegaly. Bowel sounds present.  EXTREMITIES: No edema, clubbing or cyanosis. No calf tenderness. Left arm in dressing. There are bruits in AV fistula area. SKIN: No rash or jaundice.  NEUROLOGY: Alert and oriented x3. No focal deficit. Power 5/5. Sensation intact.   LABORATORY DATA: Glucose 102, BUN 40, creatinine 6.34, sodium 134, potassium 4, chloride 104, bicarb 27. WBC 12.4, hemoglobin 12.2, platelets 169,000.   IMPRESSIONS: 1.  AV fistula blockage. 2.  End-stage renal disease, on hemodialysis.  3.  Hypertension. 4.  Chronic atrial fibrillation, rate controlled.  5.  Chronic obstructive pulmonary disease, stable.  6.  Tobacco abuse.  7.  Mild leukocytosis, possibly due  to procedure.   RECOMMENDATIONS: 1.  For AV fistula blockage, the patient is status post left brachial artery to brachial artery bypass and endarterectomy, also left brachial to axillary AV graft. Follow up with Dr. Gilda CreaseSchnier and  CBC. 2.  For Endstage renal disease, follow up with nephrology for hemodialysis tomorrow. 3.  For atrial fibrillation, continue Cardizem and Lopressor.  4.  For chronic obstructive pulmonary disease, which is stable, will continue Symbicort, Spiriva.  5. For tobacco abuse, smoking cessation was counselled for 4 minutes, give nicotine patch.  I discussed the patient's condition and plan of treatment with the patient. The patient wants FULL code.   TIME SPENT: About 52 minutes.   ____________________________ Shaune PollackQing Llewellyn Schoenberger, MD qc:sb D: 07/25/2014 16:20:49 ET T: 07/25/2014 16:53:06 ET JOB#: [id number]  cc: Shaune PollackQing Hisao Doo, MD, <Dictator> Shaune PollackQING Maliaka Brasington MD ELECTRONICALLY SIGNED 07/25/2014 17:37

## 2014-10-12 NOTE — H&P (Signed)
PATIENT NAME:  Blake Herrera, Blake Herrera MR#:  [id number] DATE OF BIRTH:  13-May-1956  DATE OF ADMISSION:  06/18/2014  PRIMARY CARE PHYSICIAN: He does not have a primary care physician.  CHIEF COMPLAINT: AFib with RVR.   HISTORY OF PRESENT ILLNESS: This is a 59 year old male who presented to the hospital today for an AV fistula placement. Preoperatively, the patient was noted to be in rapid AFib with RVR, with heart rates in the 140s. The patient received a couple doses of bolus of intravenous Cardizem with little improvement in his heart rate. He was also noted to be hypotensive and received a bolus of intravenous fluids. Due to his AFib with RVR, the surgical case was canceled. Hospitalist services were contacted for admission.   The patient denies any chest pain or any nausea, vomiting, or palpitations. He does admit that he may have a possible viral illness with cough and congestion, now for the past 2-3 days. He denies any chest pain, any abdominal pain, any diarrhea, nausea, vomiting, fevers, or any other associated symptoms. Hospitalist services were contacted for admission due to his atrial fibrillation with RVR.   PAST MEDICAL HISTORY: Consistent with end-stage renal disease, on hemodialysis Tuesdays/Thursdays/Saturday, COPD, chronic atrial fibrillation, secondary hyperparathyroidism.   ALLERGIES: No known drug allergies.   SOCIAL HISTORY: Does smoke about 1/2 pack per day; has been smoking for the past 30+ years. No alcohol abuse. No illicit drug abuse. Lives by himself.   FAMILY HISTORY: Mother died from old age. Father died from a stroke.   CURRENT MEDICATIONS: Calcium acetate 667 mg 2 tablets t.i.d., Cardizem CD 120 mg daily, Flomax 0.4 mg daily, Lasix 80 mg b.i.d., albuterol inhaler 2 puffs q. 6 hours as needed, Renagel 800 mg 3 tablets t.i.d. and 2 tablets with snacks, Spiriva 1 puff daily, Symbicort 160/4.5 two puffs b.i.d.   PHYSICAL EXAMINATION:  VITAL SIGNS: Presently, temperature is  afebrile. Pulse irregular in the 120s. Respirations 15. Blood pressure 115/91. Saturations 99% on 2 L nasal cannula.  GENERAL: He is a pleasant-appearing male in no apparent distress.  HEAD, EYES, EARS, NOSE AND THROAT: Atraumatic, normocephalic. Extraocular muscles are intact. Pupils are equal and reactive to light. Sclerae are anicteric. No conjunctival injection. No pharyngeal erythema.  NECK: Supple. There is no jugular venous distention. No bruits, no lymphadenopathy, no thyromegaly.  HEART: Irregular; no murmurs, rubs, or clicks.  LUNGS: Clear to auscultation bilaterally. No rales or rhonchi. No wheezes.  ABDOMEN: Soft, flat, nontender, nondistended. Has good bowel sounds. No hepatosplenomegaly appreciated.  EXTREMITIES: No evidence of any cyanosis, clubbing, or peripheral edema. Has +2 pedal and radial pulses bilaterally.  NEUROLOGIC: The patient is alert, awake, and oriented x 3, with no focal motor or sensory deficits appreciated bilaterally.  SKIN: Moist and warm, with no rashes appreciated.  LYMPHATIC: There is no cervical or axillary lymphadenopathy.   LABORATORY DATA: Currently pending.   ASSESSMENT AND PLAN: This is a 59 year old male with a history of end-stage renal disease on hemodialysis, hypertension, chronic obstructive pulmonary disease,  secondary hyperparathyroidism, benign prostatic hypertrophy, and chronic atrial fibrillation, who presented to the hospital for an arteriovenous fistula placement. He was noted to be in rapid atrial fibrillation with rapid ventricular response preoperatively. The surgery was canceled and the patient is being admitted for atrial fibrillation with rapid ventricular response.  1.  Atrial fibrillation with rapid ventricular response. The patient has a history of chronic atrial fibrillation versus paroxysmal atrial fibrillation. He has rates fairly uncontrolled preoperatively. He was hypotensive,  but responded well to some gentle IV fluids. The  patient has been given a bolus of intravenous Cardizem without much improvement, and is currently being started on a Cardizem drip. I will continue Cardizem drip, but also add oral Cardizem to help wean him off the drip. We will get a cardiology consult. The patient has been seen by Dr. Mariah Milling. The patient is not on long-term anticoagulation, as he has a history of gastrointestinal bleed.  2.  End-stage renal disease on hemodialysis. We will consult nephrology if the patient stays throughout the day until tomorrow, in case he needs dialysis. Presently, there is no urgent indication for dialysis.  3.  Chronic obstructive pulmonary disease. No acute exacerbation. Continue Symbicort and Spiriva.  4.  Benign prostatic hypertrophy. Continue Flomax.  5.  Secondary hyperparathyroidism. Continue Renagel and calcium acetate.   CODE STATUS: The patient is a Full Code.   CRITICAL CARE TIME SPENT: 45 minutes.    ____________________________ Rolly Pancake. Cherlynn Kaiser, MD vjs:MT D: 06/18/2014 09:20:17 ET T: 06/18/2014 09:38:55 ET JOB#: [id number]  cc: Rolly Pancake. Cherlynn Kaiser, MD, <Dictator> Houston Siren MD ELECTRONICALLY SIGNED 07/08/2014 10:37

## 2014-10-12 NOTE — Discharge Summary (Signed)
PATIENT NAME:  Blake Herrera, Blake Herrera MR#:  [id number] DATE OF BIRTH:  1956/01/10  DATE OF ADMISSION:  06/18/2014 DATE OF DISCHARGE:  06/18/2014  For detailed note, please take a look at the history and physical done on admission.   DIAGNOSES AT DISCHARGE: As follows:  Atrial fibrillation with rapid ventricular response, end-stage renal disease, on hemodialysis, chronic obstructive pulmonary disease, secondary hyperparathyroidism.   DIET: The patient is being discharged on a renal diet.   ACTIVITY: As tolerated.   FOLLOWUP: Dr. Mariah MillingGollan in the next 1 to 2 weeks.   DISCHARGE MEDICATIONS: Renagel 800 mg 3 tablets  t.i.d. with meals and 2 tablets with snacks, albuterol inhaler 2 puffs q. 6 hours as needed, Symbicort 160/4.5 two puffs b.i.d., calcium acetate 2 tablets daily, Flomax 0.4 mg daily, Cardizem CD 120 mg daily, Spiriva 1 puff daily, and Lasix 80 mg b.i.d.   CONSULTANTS DURING THE HOSPITAL COURSE: Dr. Mariah MillingGollan from cardiology.   BRIEF HOSPITAL COURSE: This is a 59 year old male who presented to the hospital for an elective AV fistula placement and preoperatively noted to be in rapid atrial fibrillation.  1.  Atrial fibrillation with rapid ventricular response. The patient has a history of chronic atrial fibrillation. His rates were uncontrolled preoperatively. He was also slightly hypotensive, therefore, hospice services were contacted for admission. He was given a couple of boluses of IV Cardizem, did not respond, was started on a Cardizem drip. After being on a Cardizem drip for a few hours and given an additional dose of oral Cardizem, his heart rates have come down to the 70s. He was ambulated and his heart rates did go up to the one-teens to 120s but comes right back down to the 60s to 70s on rest. He is clinically asymptomatic. He is therefore being discharged home. He is not on long-term anticoagulation given his history of GI bleeding.  2.  End-stage renal disease, on hemodialysis. The patient  will resume his dialysis schedule on Tuesday, Thursday, Saturday.  3.  COPD. Had no acute exacerbation.  He will continue his Symbicort and Spiriva.  4.  BPH.  He will continue his Flomax.  5.  Secondary hyperparathyroidism. He will continue his Renagel and calcium acetate.   DISCHARGE INSTRUCTIONS: The patient is being discharged home. He will have his AV fistula rescheduled through the vascular surgeon. I discussed this with Dr. Gilda CreaseSchnier.   CODE STATUS: The patient is a full code.   TIME SPENT: 35 minutes.    ____________________________ Rolly PancakeVivek J. Cherlynn KaiserSainani, MD vjs:AT D: 06/18/2014 15:51:13 ET T: 06/18/2014 22:27:22 ET JOB#: [id number]  cc: Rolly PancakeVivek J. Cherlynn KaiserSainani, MD, <Dictator> Antonieta Ibaimothy J. Gollan, MD Houston SirenVIVEK J SAINANI MD ELECTRONICALLY SIGNED 07/08/2014 10:38

## 2014-10-12 NOTE — Consult Note (Signed)
General Aspect Cardiologist:  chmg ____________   Past Medical History 1. Asthma 07/09/2013  2. ESRD (end stage renal disease) on dialysis x 1 yr.       a. s/p failed AVFs in both forearms.      b. Dialysis currently through left chest subclavian line. 3. COPD, severe  4. h/o GIB (duodenal ulcer) - 2009, 2011  5. OSA (obstructive sleep apnea)   6. Anemia   7. H. pylori infection  8. Insomnia  9. Pulmonary hypertension  10. A-fib       a. [facility] - pt unsure if this is persistent or paroxysmal. 11. Constipation  12. Depression  13. Tobacco abuse  14. Hypertension   15. Persistent loculated left pleural effusion 12/2013:          a. 12/2013 Thoracentesis [facility]):  Lymphocytic effusion, Mesothelial cells, macrophages, rare eosinophils and fibrin on cell block. 16. Injury of fifth finger of right hand 17. Gout 18. H/O pyelonephritis - 2009 19. H/O pancreatitis - 1995 _____________   Present Illness 59 y/o male with the above complex medical history.   He has ESRD and has been on dialysis x ~ 1 yr [place]  He's had two failed AVF (one in each forearm) and receives dialysis via a left chest subclavian catheter.  He also has a h/o afib, suspected to be chronic. h/o afib dating back to at least 01/2013, with an ECG in 05/2013 showing afib. He has never been on anticoagulation but does have a h/o GIB x 2, 2009 and 2011, apparently in the setting of nsaids and etoh.  In July 2015, [facility] was [facility] for a persistent loculated left pleural effusion, underwent thoracentesis in mid-July revealing a lymphocytic effusion with mesothelial cells, macrophages, rare eosinophils, and fibrin.  He lives with his dtr and grandchildren in [place].  He smokes 1 ppd.  He says that at baseline, he is capable of being fairly active w/o limitations.    He presents today for AV fistula surgery. Rate prior to surgery was 78 bpm. Unclear rhythm as no tele  strip. As he was moving to the operating table, rate increased to 150s, SBP in thr 80s. The surgery was cancelled. He was started on diltiazem infusion 10 mg/hr. Rate now 107 bpm, SBP 115 HD days on Tu/Th/Sat  Currently he denies chest pain or dyspnea @ rest. Cardiology was consulted for atrial fib and management.   Physical Exam:  GEN well developed, well nourished, no acute distress, pleasant   HEENT moist oral mucosa, poor dentition   NECK supple  no bruits/jvd.   RESP normal resp effort  diminished breath sounds bilat L worse than R with exp wheezing throughout.   CARD Irregular rate and rhythm  Tachycardic   ABD denies tenderness  normal BS  firm   LYMPH negative neck   EXTR negative cyanosis/clubbing, trace bilat LEE.   SKIN normal to palpation   NEURO motor/sensory function intact   PSYCH alert, A+O to time, place, person   Review of Systems:  Subjective/Chief Complaint nasal congestion x 2 days   Skin: No Complaints   ENT: No Complaints   Eyes: No Complaints   Neck: No Complaints   Respiratory: Short of breath  Wheezing   Cardiovascular: Palpitations  Dyspnea   Gastrointestinal: No Complaints   Genitourinary: No Complaints   Vascular: No Complaints   Musculoskeletal: No Complaints   Neurologic:  No Complaints   Hematologic: No Complaints   Endocrine: No Complaints   Psychiatric: No Complaints   Review of Systems: All other systems were reviewed and found to be negative   Medications/Allergies Reviewed Medications/Allergies reviewed   Family & Social History:  Family and Social History:  Family History Negative  No premature CAD.  Sister had cancer.   Social History negative Illicit drugs, 20+ pack year h/o tobacco abuse, currentl smoking 1 ppd  [facility] records indicate prior ETOH abuse but pt denies.   Place of Living Home  Lives in [place] with dtr and grandchildren.  Still works Presenter, broadcasting.   EKG:  EKG Interp. by me    Interpretation EKG shows afib, 150, inf infarct, poor r progression - no acute st/t changes. Tele in the ER with rate 110 bpm    No Known Allergies:    Impression 1.  Afib RVR:   Pt has a h/o afib dating back to at least 05/2013, suspect permanent.  Pt unaware. Poor outpt follow up Low normal EF on echo 8/15 Negative stress test 8/15 --Agree with IV diltiazem infusion ok to change to outpt dose, diltiazem 120 mg po daily As he is not a good candidate for anticoagulation based on previous hx and as he is on HD, can not use amiodarone. Poor candidate for TEE cardioversion. Chronic atrial fib, would be very difficult to restore NSR  2.  ESRD:   Dialysis per nephrology.  3.  Tob Abuse:   Cessation advised.  4. COPD: outpt inhalers  5. Persistent loculated left pleural effusion 12/2013:     s/p thoracentesis in the past smoking cessation   Electronic Signatures: Julien Nordmann (MD)  (Signed 06-Jan-16 09:26)  Authored: General Aspect/Present Illness, History and Physical Exam, Review of System, Family & Social History, EKG , Allergies, Impression/Plan   Last Updated: 06-Jan-16 09:26 by Julien Nordmann (MD)

## 2014-10-12 NOTE — Op Note (Signed)
PATIENT NAME:  Blake Herrera, Blake Herrera MR#:  [id number] DATE OF BIRTH:  Nov 27, 1955  DATE OF PROCEDURE:  07/25/2014  PREOPERATIVE DIAGNOSES:  1.  Brachial artery occlusion.  2.  Complication of dialysis device.  3.  End-stage renal disease requiring hemodialysis.   POSTOPERATIVE DIAGNOSES: 1.  Brachial artery occlusion.  2.  Complication of dialysis device.  3.  End-stage renal disease requiring hemodialysis.   PROCEDURE PERFORMED:  1.  Left brachial to brachial bypass grafting using reversed great saphenous vein from the left leg.  2.  Left brachial endarterectomy.  3.  Left brachial to axillary AV graft for dialysis access.   SURGEON: Renford Dills, MD   FIRST ASSISTANT: Annice Needy, MD  ANESTHESIA: General by endotracheal intubation.   FLUIDS: Per anesthesia record.   ESTIMATED BLOOD LOSS: 100 mL.   SPECIMEN: Plaque from the brachial artery to pathology for permanent section.   INDICATIONS: Mr. Doughtie is a 59 year old gentleman who has been on dialysis for quite some time and has had multiple failed accesses. Workup has identified the left arm as the most viable alternative for access, giving his venous outflow and central venous stenosis on the right. However, workup also demonstrated an occlusion of the brachial artery just distal to the origin of the brachial artery. The risks and benefits for brachial artery bypass for repair of continuity of the arterial system, and subsequent brachial axillary AV graft, were reviewed. All questions have been answered. The patient has agreed to proceed.   DESCRIPTION OF PROCEDURE: The patient is taken to the Operating Room and placed in the supine position. After adequate general anesthesia is induced and appropriate invasive monitors were placed, he was positioned supine and his left arm was extended palm upward. The left arm was prepped and draped in a circumferential fashion. The left groin was prepped and draped in a sterile fashion. Appropriate  timeout was called.   A linear incision was made along the anterior axillary line and carried down distally in a straight fashion. Dissection was carried through the soft tissues. The fascia was opened, and the brachial plexus and neurovascular bundle was identified. The brachial artery was then localized and dissected both proximally and distally. The previously placed stent, which was the source of the occlusion, was readily identified by palpation. Vessel loops were passed around the artery and its associated branches proximally, as well as distally.   A linear incision was then made in the medial aspect, just above the anterior axillary line, which will be the location of the arterial anastomosis for the AV graft. Dissection was carried down to identify the vascular bundle. This was a separate and distinct incision, located about 8 cm distal to the initial incision.   A tunneling device was then used to create a subcutaneous tunnel, and a 4-to-7-mm tapered graft was pulled subcutaneously.   The linear incision was then made in the left groin, and dissection carried down to expose the saphenofemoral junction. The saphenofemoral junction was skeletonized.  The 3-0 silk ties were utilized as needed to ligate tributaries, and then a segment of saphenous vein the approximate length of the DeBakey forceps was circumferentially dissected. This would be approximately 2 to 3 cm longer than what was needed, based on measuring the area of occlusion in the brachial artery with the same forceps.   Heparin 4000 units were given.   The saphenous vein was then harvested, ligating and dividing all tributaries, and ligating and dividing the vein proximally and distally  with 2-0 silk ties. It was then flushed and dilated. A 3.5 mm Garrett coronary dilator passed easily; a 4 did not. It was irrigated with heparinized saline and placed in solution.   The brachial artery was then controlled proximally with vessel loops.  An arteriotomy was made. Extensive plaque was noted and, therefore endarterectomy was performed with a freer elevator. The arteriotomy was modified with Potts scissors, and stay sutures of 6-0 Prolene were placed. The vein was then spatulated in the reversed fashion and an end-vein-to-side-brachial-artery anastomosis was fashioned with running 6-0 Prolene. Flushing maneuvers were performed and flow was established through the bypass. Excellent flow was noted and it was clamped just above the arterial anastomosis, after being pulled through the inner tunnel within the neurovascular bundle and lined up with the distal brachial artery.   Distal brachial artery was then controlled with Silastic vessel loops. Arteriotomy was made and extended with Potts scissors. The 6-0 Prolene stay sutures were place. The vein was spatulated and trimmed to the appropriate length, and an end-vein-to-side-brachial-artery anastomosis was fashioned with running 6-0 Prolene. Flushing maneuvers were performed and flow was established to the hand. Radial impulse was not palpated at this time; however, distal brachial pulses was excellent below the anastomosis.   The wound was then irrigated and packed with a saline moistened laparotomy pad.   Attention was then turned to the distal wound, where now that the artery was pulsatile it was localized by palpation. It was then exposed and looped proximally and distally. The graft was then pulled to the appropriate length. The artery was controlled with Silastic vessel loops. Arteriotomy was made and extended with Potts scissors, and an end-graft-to-side-brachial-artery anastomosis was fashioned with running CV-6 suture. Flushing maneuvers were performed and flow was re-established to the hand. Excellent flow was noted in the graft, and the graft was subsequently irrigated with heparinized saline and clamped just above the arterial suture line. It was adjusted for length, to the axillary vein,  which was controlled with Silastic vessel loops. It was then beveled. The vein was then opened and a venotomy was made with an 11 blade, and extended with Potts scissors. Stay sutures were placed, and end-graft-to-side-axillary-vein anastomosis was fashioned with running CV-6 suture.   Flushing maneuvers were performed. A small area of bleeding was noted, and this was controlled with 6-0 Prolene. A palpable thrill was noted in the graft upon compression. Excellent pulse was noted in the graft.   All 3 wounds were then irrigated with sterile saline. Evicel and Surgicel were placed around the anastomoses, and subsequently all 3 wounds were closed in layers using a running 3-0 Vicryl in several layers followed by surgical staples for closing the skin.   Sterile dressings were applied. The patient tolerated the procedure well. He was awakened in the Operating Room, obeying commands, and was taken to the recovery area in excellent condition.    ____________________________ Renford DillsGregory G. Annelies Coyt, MD ggs:MT D: 07/25/2014 14:07:06 ET T: 07/25/2014 15:31:40 ET JOB#: [id number]  cc: Renford DillsGregory G. Zalika Tieszen, MD, <Dictator> Renford DillsGREGORY G Marton Malizia MD ELECTRONICALLY SIGNED 08/19/2014 14:24

## 2015-05-18 ENCOUNTER — Ambulatory Visit: Payer: Medicare Other | Admitting: Cardiovascular Disease

## 2015-05-18 ENCOUNTER — Encounter: Payer: Self-pay | Admitting: *Deleted

## 2015-05-18 DIAGNOSIS — R0989 Other specified symptoms and signs involving the circulatory and respiratory systems: Secondary | ICD-10-CM

## 2015-06-02 ENCOUNTER — Telehealth: Payer: Self-pay | Admitting: Cardiovascular Disease

## 2015-06-02 NOTE — Telephone Encounter (Signed)
3 attempts to schedule fu from recall list . lmov to schedule fu with office deleting recall.

## 2015-06-22 ENCOUNTER — Encounter: Payer: Self-pay | Admitting: Emergency Medicine

## 2015-06-22 ENCOUNTER — Emergency Department: Payer: Medicare Other

## 2015-06-22 ENCOUNTER — Inpatient Hospital Stay
Admission: EM | Admit: 2015-06-22 | Discharge: 2015-06-23 | DRG: 308 | Disposition: A | Discharge status: Home / Self Care | Payer: Medicare Other | Attending: Internal Medicine | Admitting: Internal Medicine

## 2015-06-22 DIAGNOSIS — J9601 Acute respiratory failure with hypoxia: Secondary | ICD-10-CM | POA: Diagnosis present

## 2015-06-22 DIAGNOSIS — Z8711 Personal history of peptic ulcer disease: Secondary | ICD-10-CM | POA: Diagnosis not present

## 2015-06-22 DIAGNOSIS — I272 Other secondary pulmonary hypertension: Secondary | ICD-10-CM | POA: Diagnosis present

## 2015-06-22 DIAGNOSIS — Z8249 Family history of ischemic heart disease and other diseases of the circulatory system: Secondary | ICD-10-CM | POA: Diagnosis not present

## 2015-06-22 DIAGNOSIS — J441 Chronic obstructive pulmonary disease with (acute) exacerbation: Secondary | ICD-10-CM | POA: Diagnosis present

## 2015-06-22 DIAGNOSIS — K859 Acute pancreatitis without necrosis or infection, unspecified: Secondary | ICD-10-CM | POA: Diagnosis present

## 2015-06-22 DIAGNOSIS — I480 Paroxysmal atrial fibrillation: Secondary | ICD-10-CM | POA: Diagnosis present

## 2015-06-22 DIAGNOSIS — D631 Anemia in chronic kidney disease: Secondary | ICD-10-CM | POA: Diagnosis present

## 2015-06-22 DIAGNOSIS — F17210 Nicotine dependence, cigarettes, uncomplicated: Secondary | ICD-10-CM | POA: Diagnosis present

## 2015-06-22 DIAGNOSIS — F329 Major depressive disorder, single episode, unspecified: Secondary | ICD-10-CM | POA: Diagnosis present

## 2015-06-22 DIAGNOSIS — N186 End stage renal disease: Secondary | ICD-10-CM | POA: Diagnosis present

## 2015-06-22 DIAGNOSIS — J45909 Unspecified asthma, uncomplicated: Secondary | ICD-10-CM | POA: Diagnosis present

## 2015-06-22 DIAGNOSIS — R0602 Shortness of breath: Secondary | ICD-10-CM | POA: Diagnosis present

## 2015-06-22 DIAGNOSIS — I120 Hypertensive chronic kidney disease with stage 5 chronic kidney disease or end stage renal disease: Secondary | ICD-10-CM | POA: Diagnosis present

## 2015-06-22 DIAGNOSIS — G4733 Obstructive sleep apnea (adult) (pediatric): Secondary | ICD-10-CM | POA: Diagnosis present

## 2015-06-22 LAB — CBC
HCT: 38.9 % — ABNORMAL LOW (ref 40.0–52.0)
Hemoglobin: 12.9 g/dL — ABNORMAL LOW (ref 13.0–18.0)
MCH: 31.8 pg (ref 26.0–34.0)
MCHC: 33 g/dL (ref 32.0–36.0)
MCV: 96.2 fL (ref 80.0–100.0)
PLATELETS: 142 10*3/uL — AB (ref 150–440)
RBC: 4.04 MIL/uL — ABNORMAL LOW (ref 4.40–5.90)
RDW: 15.2 % — AB (ref 11.5–14.5)
WBC: 9.4 10*3/uL (ref 3.8–10.6)

## 2015-06-22 LAB — COMPREHENSIVE METABOLIC PANEL
ALBUMIN: 3.7 g/dL (ref 3.5–5.0)
ALT: 17 U/L (ref 17–63)
ANION GAP: 8 (ref 5–15)
AST: 24 U/L (ref 15–41)
Alkaline Phosphatase: 252 U/L — ABNORMAL HIGH (ref 38–126)
BILIRUBIN TOTAL: 1.4 mg/dL — AB (ref 0.3–1.2)
BUN: 43 mg/dL — ABNORMAL HIGH (ref 6–20)
CHLORIDE: 101 mmol/L (ref 101–111)
CO2: 27 mmol/L (ref 22–32)
Calcium: 8.6 mg/dL — ABNORMAL LOW (ref 8.9–10.3)
Creatinine, Ser: 5.96 mg/dL — ABNORMAL HIGH (ref 0.61–1.24)
GFR calc Af Amer: 11 mL/min — ABNORMAL LOW (ref >60)
GFR calc non Af Amer: 9 mL/min — ABNORMAL LOW (ref >60)
GLUCOSE: 93 mg/dL (ref 65–99)
POTASSIUM: 3.6 mmol/L (ref 3.5–5.1)
Sodium: 136 mmol/L (ref 135–145)
TOTAL PROTEIN: 7.6 g/dL (ref 6.5–8.1)

## 2015-06-22 LAB — TROPONIN I: Troponin I: <0.03 ng/mL (ref <0.031)

## 2015-06-22 LAB — TSH: TSH: 2.346 u[IU]/mL (ref 0.350–4.500)

## 2015-06-22 LAB — BRAIN NATRIURETIC PEPTIDE: B NATRIURETIC PEPTIDE 5: 728 pg/mL — AB (ref 0.0–100.0)

## 2015-06-22 LAB — MAGNESIUM: MAGNESIUM: 2 mg/dL (ref 1.7–2.4)

## 2015-06-22 MED ORDER — FUROSEMIDE 80 MG PO TABS
80.0000 mg | ORAL_TABLET | Freq: Two times a day (BID) | ORAL | Status: DC
  Administered 2015-06-23: 80 mg via ORAL
  Filled 2015-06-22: qty 1

## 2015-06-22 MED ORDER — SEVELAMER CARBONATE 800 MG PO TABS
2400.0000 mg | ORAL_TABLET | Freq: Three times a day (TID) | ORAL | Status: DC
  Administered 2015-06-23 (×2): 2400 mg via ORAL
  Filled 2015-06-22 (×2): qty 3

## 2015-06-22 MED ORDER — LISINOPRIL 5 MG PO TABS
5.0000 mg | ORAL_TABLET | Freq: Every day | ORAL | Status: DC
  Administered 2015-06-23: 5 mg via ORAL
  Filled 2015-06-22: qty 1

## 2015-06-22 MED ORDER — ACETAMINOPHEN 650 MG RE SUPP: 650.0000 mg | Freq: Four times a day (QID) | RECTAL | Status: DC | PRN

## 2015-06-22 MED ORDER — CALCIUM ACETATE (PHOS BINDER) 667 MG PO CAPS
1334.0000 mg | ORAL_CAPSULE | Freq: Three times a day (TID) | ORAL | Status: DC
Start: 2015-06-23 — End: 2015-06-23
  Administered 2015-06-23 (×2): 1334 mg via ORAL
  Filled 2015-06-22 (×2): qty 2

## 2015-06-22 MED ORDER — DILTIAZEM LOAD VIA INFUSION: 15.0000 mg | Freq: Once | INTRAVENOUS | Status: DC

## 2015-06-22 MED ORDER — ACETAMINOPHEN 325 MG PO TABS: 650.0000 mg | ORAL_TABLET | Freq: Four times a day (QID) | ORAL | Status: DC | PRN

## 2015-06-22 MED ORDER — TAMSULOSIN HCL 0.4 MG PO CAPS
0.4000 mg | ORAL_CAPSULE | Freq: Every day | ORAL | Status: DC
  Administered 2015-06-22: 0.4 mg via ORAL
  Filled 2015-06-22: qty 1

## 2015-06-22 MED ORDER — DILTIAZEM HCL 25 MG/5ML IV SOLN
15.0000 mg | Freq: Once | INTRAVENOUS | Status: DC
  Administered 2015-06-22: 15 mg via INTRAVENOUS

## 2015-06-22 MED ORDER — DILTIAZEM HCL ER COATED BEADS 120 MG PO CP24
120.0000 mg | ORAL_CAPSULE | Freq: Two times a day (BID) | ORAL | Status: DC
  Administered 2015-06-22 – 2015-06-23 (×2): 120 mg via ORAL
  Filled 2015-06-22 (×2): qty 1

## 2015-06-22 MED ORDER — METHYLPREDNISOLONE SODIUM SUCC 125 MG IJ SOLR
60.0000 mg | Freq: Four times a day (QID) | INTRAMUSCULAR | Status: DC
  Administered 2015-06-22 – 2015-06-23 (×3): 60 mg via INTRAVENOUS
  Filled 2015-06-22 (×3): qty 2

## 2015-06-22 MED ORDER — CEFTRIAXONE SODIUM 1 G IJ SOLR
1.0000 g | INTRAMUSCULAR | Status: DC
  Administered 2015-06-22: 1 g via INTRAVENOUS
  Filled 2015-06-22 (×2): qty 10

## 2015-06-22 MED ORDER — METOPROLOL SUCCINATE ER 50 MG PO TB24
50.0000 mg | ORAL_TABLET | Freq: Every day | ORAL | Status: DC
  Administered 2015-06-22: 50 mg via ORAL
  Filled 2015-06-22: qty 1

## 2015-06-22 MED ORDER — DILTIAZEM HCL 25 MG/5ML IV SOLN
INTRAVENOUS | Status: AC
  Administered 2015-06-22: 15 mg via INTRAVENOUS
  Filled 2015-06-22: qty 5

## 2015-06-22 MED ORDER — SODIUM CHLORIDE 0.9 % IJ SOLN
3.0000 mL | Freq: Two times a day (BID) | INTRAMUSCULAR | Status: DC
  Administered 2015-06-22 – 2015-06-23 (×2): 3 mL via INTRAVENOUS

## 2015-06-22 MED ORDER — ONDANSETRON HCL 4 MG PO TABS: 4.0000 mg | ORAL_TABLET | Freq: Four times a day (QID) | ORAL | Status: DC | PRN

## 2015-06-22 MED ORDER — SEVELAMER CARBONATE 800 MG PO TABS: 1600.0000 mg | ORAL_TABLET | Freq: Every day | ORAL | Status: DC

## 2015-06-22 MED ORDER — SEVELAMER CARBONATE 800 MG PO TABS: 1600.0000 mg | ORAL_TABLET | Freq: Two times a day (BID) | ORAL | Status: DC | PRN

## 2015-06-22 MED ORDER — IPRATROPIUM-ALBUTEROL 0.5-2.5 (3) MG/3ML IN SOLN
3.0000 mL | Freq: Four times a day (QID) | RESPIRATORY_TRACT | Status: DC
  Administered 2015-06-23 (×3): 3 mL via RESPIRATORY_TRACT
  Filled 2015-06-22 (×3): qty 3

## 2015-06-22 MED ORDER — DEXTROSE 5 % IV SOLN
10.0000 mg/h | Freq: Once | INTRAVENOUS | Status: AC
  Administered 2015-06-22: 5 mg/h via INTRAVENOUS
  Filled 2015-06-22: qty 100

## 2015-06-22 MED ORDER — HEPARIN SODIUM (PORCINE) 5000 UNIT/ML IJ SOLN
5000.0000 [IU] | Freq: Three times a day (TID) | INTRAMUSCULAR | Status: DC
  Administered 2015-06-22 – 2015-06-23 (×2): 5000 [IU] via SUBCUTANEOUS
  Filled 2015-06-22 (×2): qty 1

## 2015-06-22 MED ORDER — DILTIAZEM HCL 100 MG IV SOLR
5.0000 mg/h | INTRAVENOUS | Status: DC
  Administered 2015-06-22: 10 mg/h via INTRAVENOUS
  Filled 2015-06-22: qty 100

## 2015-06-22 MED ORDER — ONDANSETRON HCL 4 MG/2ML IJ SOLN: 4.0000 mg | Freq: Four times a day (QID) | INTRAMUSCULAR | Status: DC | PRN

## 2015-06-22 MED ORDER — AZITHROMYCIN 250 MG PO TABS
250.0000 mg | ORAL_TABLET | Freq: Every day | ORAL | Status: DC
  Administered 2015-06-23: 250 mg via ORAL
  Filled 2015-06-22: qty 1

## 2015-06-22 MED ORDER — AZITHROMYCIN 250 MG PO TABS
500.0000 mg | ORAL_TABLET | Freq: Every day | ORAL | Status: AC
  Administered 2015-06-22: 500 mg via ORAL
  Filled 2015-06-22: qty 2

## 2015-06-22 MED ORDER — BUDESONIDE 0.25 MG/2ML IN SUSP
0.2500 mg | Freq: Two times a day (BID) | RESPIRATORY_TRACT | Status: DC
  Administered 2015-06-23: 0.25 mg via RESPIRATORY_TRACT
  Filled 2015-06-22: qty 2

## 2015-06-22 NOTE — ED Notes (Signed)
States feels less short of breath though not totally relieved.

## 2015-06-22 NOTE — ED Provider Notes (Signed)
Time Seen: Approximately 1740 I have reviewed the triage notes  Chief Complaint: Shortness of Breath   History of Present Illness: Blake Herrera is a 60 y.o. male who has a previous history of atrial fibrillation. He also describes a recent episodes of feeling that his heart was racing while he was receiving dialysis. He states usually these episodes would resolve themselves after the dialysis was completed. Today he states it seemed to be worse and he felt increased shortness of breath and spoke to the dialysis nurse. So states his symptoms still persisted arrives with atrial fibrillation rapid ventricular rate. He describes shortness of breath without any chest pain. Denies any increased peripheral edema, fever or any other concerns.   Past Medical History  Diagnosis Date  . Asthma   . ESRD (end stage renal disease) (HCC)   . COPD, severe (HCC)   . GIB (gastrointestinal bleeding)   . OSA (obstructive sleep apnea)   . Anemia   . H. pylori infection   . Insomnia   . A-fib (HCC)   . Pulmonary HTN (HCC)   . Depression   . Tobacco abuse   . Pleural effusion   . Gout   . H/O pyelonephritis   . Pancreatitis   . Hypertension   . Dialysis patient (HCC)   . A-fib (HCC)   . Arrhythmia     a-fib    Patient Active Problem List   Diagnosis Date Noted  . COPD exacerbation (HCC) 06/22/2015  . Atrial fibrillation, unspecified 07/03/2014  . Smoker 07/03/2014  . Essential hypertension 07/03/2014  . History of GI bleed 07/03/2014  . Pleural effusion 07/03/2014  . End stage renal disease (HCC) 07/03/2014  . Dependence on hemodialysis (HCC) 07/03/2014  . Emphysema of lung (HCC) 07/03/2014  . Preop cardiovascular exam 07/03/2014    Past Surgical History  Procedure Laterality Date  . Left arm fistula      Past Surgical History  Procedure Laterality Date  . Left arm fistula      No current outpatient prescriptions on file.  Allergies:  Review of patient's allergies indicates  no known allergies.  Family History: Family History  Problem Relation Age of Onset  . Hypertension Mother   . Hyperlipidemia Mother   . Hypertension Father   . Hyperlipidemia Father   . Hypertension Brother     Social History: Social History  Substance Use Topics  . Smoking status: Current Every Day Smoker -- 0.50 packs/day for 35 years    Types: Cigarettes  . Smokeless tobacco: None  . Alcohol Use: No     Review of Systems:   10 point review of systems was performed and was otherwise negative:  Constitutional: No fever generalized fatigue Eyes: No visual disturbances ENT: No sore throat, ear pain Cardiac: No chest pain Respiratory: Shortness of breath without wheezing or stridor Abdomen: No abdominal pain, no vomiting, No diarrhea Endocrine: No weight loss, No night sweats Extremities: No peripheral edema, cyanosis Skin: No rashes, easy bruising Neurologic: No focal weakness, trouble with speech or swollowing Urologic: Patient does not put out any urine on his own with dialysis. His dialysis is normally Tuesday, Thursdays, Friday. Due to weather conditions he is scheduled for dialysis today which he completed a three-hour treatment and is also scheduled for dialysis tomorrow   Physical Exam:  ED Triage Vitals  Enc Vitals Group     BP 06/22/15 1724 158/95 mmHg     Pulse Rate 06/22/15 1724 107  Resp 06/22/15 1724 22     Temp 06/22/15 1724 97.9 F (36.6 C)     Temp Source 06/22/15 1724 Oral     SpO2 06/22/15 1724 88 %     Weight 06/22/15 1724 200 lb (90.719 kg)     Height 06/22/15 1724 6' (1.829 m)     Head Cir --      Peak Flow --      Pain Score 06/22/15 1718 0     Pain Loc --      Pain Edu? --      Excl. in GC? --     General: Awake , Alert , and Oriented times 3; GCS 15 Head: Normal cephalic , atraumatic Eyes: Pupils equal , round, reactive to light Nose/Throat: No nasal drainage, patent upper airway without erythema or exudate.  Neck: Supple, Full  range of motion, No anterior adenopathy or palpable thyroid masses Lungs: Clear to ascultation without wheezes , rhonchi, or rales Heart: Irregular rate, irregular rhythm without murmurs , gallops , or rubs Abdomen: Soft, non tender without rebound, guarding , or rigidity; bowel sounds positive and symmetric in all 4 quadrants. No organomegaly .        Extremities: 2 plus symmetric pulses. No edema, clubbing or cyanosis Neurologic: normal ambulation, Motor symmetric without deficits, sensory intact Skin: warm, dry, no rashes   Labs:   All laboratory work was reviewed including any pertinent negatives or positives listed below:  Labs Reviewed  CBC - Abnormal; Notable for the following:    RBC 4.04 (*)    Hemoglobin 12.9 (*)    HCT 38.9 (*)    RDW 15.2 (*)    Platelets 142 (*)    All other components within normal limits  COMPREHENSIVE METABOLIC PANEL - Abnormal; Notable for the following:    BUN 43 (*)    Creatinine, Ser 5.96 (*)    Calcium 8.6 (*)    Alkaline Phosphatase 252 (*)    Total Bilirubin 1.4 (*)    GFR calc non Af Amer 9 (*)    GFR calc Af Amer 11 (*)    All other components within normal limits  BRAIN NATRIURETIC PEPTIDE - Abnormal; Notable for the following:    B Natriuretic Peptide 728.0 (*)    All other components within normal limits  TROPONIN I  MAGNESIUM  TSH  BASIC METABOLIC PANEL  CBC   review of laboratory work shows an elevated BNP otherwise no new significant findings  EKG:  ED ECG REPORT I, Jennye Moccasin, the attending physician, personally viewed and interpreted this ECG.  Date: 06/22/2015 EKG Time: 1736 Rate: 115 Rhythm: Atrial fibrillation QRS Axis: Left anterior fascicular block Intervals: normal ST/T Wave abnormalities: Nonspecific ST-T wave changes Conduction Disutrbances: none Narrative Interpretation: unremarkable No acute ischemic changes   Radiology:    CLINICAL DATA: Shortness of breath, onset today during  dialysis.  EXAM: PORTABLE CHEST 1 VIEW  COMPARISON: PA and lateral views 07/16/2014, prior exams dating back to 01/14/2014 reviewed  FINDINGS: Volume loss in the left lower lobe with basilar opacity and pleural thickening/ effusion, unchanged in appearance from prior exam. Cardiomediastinal contours are unchanged with mild cardiomegaly. The right lung is clear. Prominent central pulmonary vasculature without overt edema. No pneumothorax.  IMPRESSION: Chronic opacity at the left lung base, overall unchanged dating back to 2015. No superimposed acute process.   I personally reviewed the radiologic studies    Critical Care:  CRITICAL CARE Performed by: Jennye Moccasin  Total critical care time: 34 minutes  Critical care time was exclusive of separately billable procedures and treating other patients.  Critical care was necessary to treat or prevent imminent or life-threatening deterioration.  Critical care was time spent personally by me on the following activities: development of treatment plan with patient and/or surrogate as well as nursing, discussions with consultants, evaluation of patient's response to treatment, examination of patient, obtaining history from patient or surrogate, ordering and performing treatments and interventions, ordering and review of laboratory studies, ordering and review of radiographic studies, pulse oximetry and re-evaluation of patient's condition. Initiation treatment a rapid ventricular atrial fibrillation with antiarrhythmic therapy    ED Course:  The patient was started on diltiazem bolus which seemed to decrease his heart rate along with his blood pressure and improved him clinically with easier breathing. Was followed with a diltiazem drip. Patient tolerated his stay here well and clinically improved though his heart rate still remains in a atrial fibrillation. His rate decreased from 130 to 105 range. Patient does not show any  significant pulmonary edema on his chest x-ray does at risk with his history of renal failure and now rapid ventricular rate with his atrial fibrillation. His BNP is slightly elevated.    Assessment:  Paroxysmal atrial fibrillation   Final Clinical Impression:   Final diagnoses:  Paroxysmal atrial fibrillation [facility])     Plan:  Inpatient management            Jennye Moccasin, MD 06/22/15 2225

## 2015-06-22 NOTE — ED Notes (Signed)
Pt to ed with c/o sob that started while having dialysis about 30 minutes ago.
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
## 2015-06-23 LAB — CBC
HEMATOCRIT: 35 % — AB (ref 40.0–52.0)
HEMOGLOBIN: 11.6 g/dL — AB (ref 13.0–18.0)
MCH: 30.9 pg (ref 26.0–34.0)
MCHC: 33.1 g/dL (ref 32.0–36.0)
MCV: 93.5 fL (ref 80.0–100.0)
Platelets: 113 10*3/uL — ABNORMAL LOW (ref 150–440)
RBC: 3.74 MIL/uL — ABNORMAL LOW (ref 4.40–5.90)
RDW: 14.5 % (ref 11.5–14.5)
WBC: 5.1 10*3/uL (ref 3.8–10.6)

## 2015-06-23 LAB — BASIC METABOLIC PANEL
ANION GAP: 9 (ref 5–15)
BUN: 58 mg/dL — AB (ref 6–20)
CHLORIDE: 102 mmol/L (ref 101–111)
CO2: 24 mmol/L (ref 22–32)
Calcium: 7.9 mg/dL — ABNORMAL LOW (ref 8.9–10.3)
Creatinine, Ser: 7.42 mg/dL — ABNORMAL HIGH (ref 0.61–1.24)
GFR calc Af Amer: 8 mL/min — ABNORMAL LOW (ref >60)
GFR, EST NON AFRICAN AMERICAN: 7 mL/min — AB (ref >60)
GLUCOSE: 179 mg/dL — AB (ref 65–99)
POTASSIUM: 4.7 mmol/L (ref 3.5–5.1)
Sodium: 135 mmol/L (ref 135–145)

## 2015-06-23 LAB — MRSA PCR SCREENING: MRSA by PCR: NEGATIVE

## 2015-06-23 MED ORDER — PREDNISONE 10 MG PO TABS: 10.0000 mg | ORAL_TABLET | Freq: Every day | ORAL | Status: DC

## 2015-06-23 MED ORDER — AZITHROMYCIN 250 MG PO TABS: ORAL_TABLET | ORAL | Status: DC

## 2015-06-23 MED ORDER — METHYLPREDNISOLONE SODIUM SUCC 40 MG IJ SOLR
40.0000 mg | Freq: Two times a day (BID) | INTRAMUSCULAR | Status: DC
  Administered 2015-06-23: 40 mg via INTRAVENOUS
  Filled 2015-06-23: qty 1

## 2015-06-23 NOTE — Plan of Care (Signed)
Problem: Education: Goal: Knowledge of [facility] Education information/materials will improve Outcome: Completed/Met Date Met:  06/23/15 Handouts given to patient
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
## 2015-06-23 NOTE — Care Management (Signed)
Patient  would like to get established with a local physician.  He does not have any restrictions.  Discussed [facility]  and patient in agreement.  Unit secretary made an appointment for patient.  Patient with ESRD and [place] [address]"  Patient denies issues with transportation or obtaining his medications

## 2015-06-23 NOTE — Care Management (Addendum)
Presents from home with shortness of breath.  He has ESRD and receives chronic hemodialysis.  He was found to be in Atrial Fib with RVR and placed on Cardizem drip which has since been discontinued. he is admitted with exac of COPD.  Patient had an initial room air stat of 88.  Patient is not on chronic home 02.  Will discuss the need to assess for need of home 02 prior to discahrge and to keep patient mobilized during his stay.  No issues accessing medical care, transportation or obtaining medications.  Notified dialysis coordinator of admisison

## 2015-06-23 NOTE — Care Management Note (Signed)
[facility] is [facility] [place] [facility] schedule.  I have sent admission records to the clinic and will send additional records at discharge.  Ivor ReiningKim Francisca Harbuck  Dialysis Liaison  [phone]

## 2015-06-23 NOTE — Plan of Care (Signed)
Problem: Health Behavior/Discharge Planning: Goal: Ability to manage health-related needs will improve Outcome: Not Progressing Patient has not been taking his cardiac medications.

## 2015-06-23 NOTE — Progress Notes (Signed)
Patient back from hemo, RA ambulation complete and patient does not require oxygen. Waiting on daughter to pick up for discharge. No complaints, vss. Will remove IV and tele when daughter arrives. Education provided at this time and discharge complete. Trudee KusterBrandi R Mansfield

## 2015-06-23 NOTE — Progress Notes (Signed)
SATURATION QUALIFICATIONS: (This note is used to comply with regulatory documentation for home oxygen)  Patient Saturations on Room Air at Rest = 94%  Patient Saturations on Room Air while Ambulating = 92%  Patient Saturations on NA Liters of oxygen while Ambulating = NA  Principal FinancialBrandi R Herrera

## 2015-06-23 NOTE — Progress Notes (Signed)
Orders to ambulate patient on RA. Patient sats 88-89% ambulating on RA. Dr. Juliene PinaMody notified and ordered for patient to ambulated on RA again after hemodialysis.  Trudee KusterBrandi R Mansfield

## 2015-06-23 NOTE — Progress Notes (Signed)
Subjective:  Patient presents for palpitations and shortness of breath experienced during dialysis. He states this has been going on for the past 3-4 months. He received his dialysis yesterday prior to admission. He experienced same symptoms yesterday, therefore decided to come to the hospital for evaluation He states the symptoms starts only after he starts dialysis   Objective:  Vital signs in last 24 hours:  Temp:  [97.9 F (36.6 C)-98.3 F (36.8 C)] 98.1 F (36.7 C) (01/10 1142) Pulse Rate:  [29-146] 63 (01/10 1142) Resp:  [16-102] 16 (01/10 1142) BP: (106-176)/(63-114) 122/68 mmHg (01/10 1142) SpO2:  [88 %-100 %] 92 % (01/10 1142) Weight:  [88.678 kg (195 lb 8 oz)-90.719 kg (200 lb)] 88.678 kg (195 lb 8 oz) (01/09 2112)  Weight change:  Filed Weights   06/22/15 1724 06/22/15 2112  Weight: 90.719 kg (200 lb) 88.678 kg (195 lb 8 oz)    Intake/Output:    Intake/Output Summary (Last 24 hours) at 06/23/15 1256 Last data filed at 06/23/15 1024  Gross per 24 hour  Intake    240 ml  Output     50 ml  Net    190 ml     Physical Exam: General:  no acute distress   HEENT  anicteric sclera   Neck  supple   Pulm/lungs  mild scattered rhonchi   CVS/Heart  irregular rhythm, tachycardic   Abdomen:   soft, nontender, nondistended   Extremities:  trace to 1+ peripheral edema   Neurologic:  alert, oriented   Skin:  no acute rashes   Access:  AV fistula        Basic Metabolic Panel:   Recent Labs Lab 06/22/15 1750 06/23/15 0508  NA 136 135  K 3.6 4.7  CL 101 102  CO2 27 24  GLUCOSE 93 179*  BUN 43* 58*  CREATININE 5.96* 7.42*  CALCIUM 8.6* 7.9*  MG 2.0  --      CBC:  Recent Labs Lab 06/22/15 1750 06/23/15 0508  WBC 9.4 5.1  HGB 12.9* 11.6*  HCT 38.9* 35.0*  MCV 96.2 93.5  PLT 142* 113*      Microbiology:  Recent Results (from the past 720 hour(s))  MRSA PCR Screening     Status: None   Collection Time: 06/23/15  1:04 AM  Result Value Ref  Range Status   MRSA by PCR NEGATIVE NEGATIVE Final    Comment:        The GeneXpert MRSA Assay (FDA approved for NASAL specimens only), is one component of a comprehensive MRSA colonization surveillance program. It is not intended to diagnose MRSA infection nor to guide or monitor treatment for MRSA infections.     Coagulation Studies: No results for input(s): LABPROT, INR in the last 72 hours.  Urinalysis: No results for input(s): COLORURINE, LABSPEC, PHURINE, GLUCOSEU, HGBUR, BILIRUBINUR, KETONESUR, PROTEINUR, UROBILINOGEN, NITRITE, LEUKOCYTESUR in the last 72 hours.  Invalid input(s): APPERANCEUR    Imaging: Dg Chest Portable 1 View  06/22/2015  CLINICAL DATA:  Shortness of breath, onset today during dialysis. EXAM: PORTABLE CHEST 1 VIEW COMPARISON:  PA and lateral views 07/16/2014, prior exams dating back to 01/14/2014 reviewed FINDINGS: Volume loss in the left lower lobe with basilar opacity and pleural thickening/ effusion, unchanged in appearance from prior exam. Cardiomediastinal contours are unchanged with mild cardiomegaly. The right lung is clear. Prominent central pulmonary vasculature without overt edema. No pneumothorax. IMPRESSION: Chronic opacity at the left lung base, overall unchanged dating back to 2015. No  superimposed acute process. Electronically Signed   By: Rubye Oaks M.D.   On: 06/22/2015 18:31     Medications:     . azithromycin  250 mg Oral Daily  . budesonide (PULMICORT) nebulizer solution  0.25 mg Nebulization BID  . calcium acetate  1,334 mg Oral TID WC  . cefTRIAXone (ROCEPHIN)  IV  1 g Intravenous Q24H  . diltiazem  120 mg Oral BID  . diltiazem  15 mg Intravenous Once  . furosemide  80 mg Oral BID  . heparin  5,000 Units Subcutaneous 3 times per day  . ipratropium-albuterol  3 mL Nebulization Q6H  . lisinopril  5 mg Oral Daily  . methylPREDNISolone (SOLU-MEDROL) injection  40 mg Intravenous Q12H  . metoprolol succinate  50 mg Oral QHS   . sevelamer carbonate  2,400 mg Oral TID WC  . sodium chloride  3 mL Intravenous Q12H  . tamsulosin  0.4 mg Oral QHS   acetaminophen **OR** acetaminophen, ondansetron **OR** ondansetron (ZOFRAN) IV, sevelamer carbonate  Assessment/ Plan:  60 y.o. male with end-stage renal disease, COPD, history of duodenal ulcer with GI bleed, obstructive sleep apnea, anemia, pulmonary hypertension, depression, history of loculated left pleural effusion in July 2015, gout, prior history of pancreatitis  1. End-stage renal disease. [place] dialysis. Tuesday/ Thursday/Saturday Patient did receive his makeup dialysis yesterday because he missed Saturday due to weather conditions Plan for treatment later today 2. Atrial fibrillation with RVR Plan as per internal medicine 3. Anemia of chronic kidney disease Hemoglobin acceptable    LOS: 1 Blake Herrera 1/10/201712:56 PM

## 2015-06-23 NOTE — Progress Notes (Signed)
Informed Dr. Anne HahnWillis that patient is currently on a cardizem  Drip at 12.5. BP 106/63 and HR 65-70. PO cardizem has been given.  Per Dr. Anne HahnWillis D/C cardizem drip... will continue to monitor.

## 2015-06-24 LAB — HEPATITIS B SURFACE ANTIGEN: HEP B S AG: NEGATIVE

## 2015-07-13 ENCOUNTER — Other Ambulatory Visit: Payer: Self-pay | Admitting: Family Medicine

## 2015-07-13 DIAGNOSIS — R1906 Epigastric swelling, mass or lump: Secondary | ICD-10-CM

## 2015-07-13 DIAGNOSIS — R1900 Intra-abdominal and pelvic swelling, mass and lump, unspecified site: Secondary | ICD-10-CM

## 2015-07-15 ENCOUNTER — Other Ambulatory Visit: Payer: Self-pay | Admitting: Family Medicine

## 2015-07-15 DIAGNOSIS — R1906 Epigastric swelling, mass or lump: Secondary | ICD-10-CM

## 2015-07-15 DIAGNOSIS — R1900 Intra-abdominal and pelvic swelling, mass and lump, unspecified site: Secondary | ICD-10-CM

## 2015-07-20 ENCOUNTER — Ambulatory Visit
Admission: RE | Admit: 2015-07-20 | Discharge: 2015-07-20 | Disposition: A | Discharge status: Home / Self Care | Payer: Medicare Other | Source: Ambulatory Visit | Attending: Family Medicine | Admitting: Family Medicine

## 2015-07-20 DIAGNOSIS — R1906 Epigastric swelling, mass or lump: Secondary | ICD-10-CM

## 2015-07-20 DIAGNOSIS — I77811 Abdominal aortic ectasia: Secondary | ICD-10-CM | POA: Insufficient documentation

## 2015-07-20 DIAGNOSIS — R938 Abnormal findings on diagnostic imaging of other specified body structures: Secondary | ICD-10-CM | POA: Diagnosis not present

## 2015-07-20 DIAGNOSIS — R1900 Intra-abdominal and pelvic swelling, mass and lump, unspecified site: Secondary | ICD-10-CM | POA: Diagnosis present

## 2015-07-20 DIAGNOSIS — N2889 Other specified disorders of kidney and ureter: Secondary | ICD-10-CM | POA: Insufficient documentation

## 2015-09-19 ENCOUNTER — Emergency Department
Admission: EM | Admit: 2015-09-19 | Discharge: 2015-09-19 | Disposition: A | Discharge status: Home / Self Care | Payer: Medicare Other | Attending: Emergency Medicine | Admitting: Emergency Medicine

## 2015-09-19 ENCOUNTER — Emergency Department: Payer: Medicare Other

## 2015-09-19 DIAGNOSIS — I4891 Unspecified atrial fibrillation: Secondary | ICD-10-CM | POA: Diagnosis not present

## 2015-09-19 DIAGNOSIS — F17210 Nicotine dependence, cigarettes, uncomplicated: Secondary | ICD-10-CM | POA: Diagnosis not present

## 2015-09-19 DIAGNOSIS — J441 Chronic obstructive pulmonary disease with (acute) exacerbation: Secondary | ICD-10-CM | POA: Diagnosis not present

## 2015-09-19 DIAGNOSIS — Z992 Dependence on renal dialysis: Secondary | ICD-10-CM | POA: Insufficient documentation

## 2015-09-19 DIAGNOSIS — N186 End stage renal disease: Secondary | ICD-10-CM | POA: Diagnosis not present

## 2015-09-19 DIAGNOSIS — I120 Hypertensive chronic kidney disease with stage 5 chronic kidney disease or end stage renal disease: Secondary | ICD-10-CM | POA: Diagnosis not present

## 2015-09-19 DIAGNOSIS — R0602 Shortness of breath: Secondary | ICD-10-CM | POA: Diagnosis present

## 2015-09-19 DIAGNOSIS — F329 Major depressive disorder, single episode, unspecified: Secondary | ICD-10-CM | POA: Insufficient documentation

## 2015-09-19 LAB — BASIC METABOLIC PANEL
ANION GAP: 9 (ref 5–15)
BUN: 41 mg/dL — ABNORMAL HIGH (ref 6–20)
CALCIUM: 7.9 mg/dL — AB (ref 8.9–10.3)
CO2: 24 mmol/L (ref 22–32)
Chloride: 97 mmol/L — ABNORMAL LOW (ref 101–111)
Creatinine, Ser: 7.18 mg/dL — ABNORMAL HIGH (ref 0.61–1.24)
GFR, EST AFRICAN AMERICAN: 9 mL/min — AB (ref >60)
GFR, EST NON AFRICAN AMERICAN: 7 mL/min — AB (ref >60)
GLUCOSE: 96 mg/dL (ref 65–99)
POTASSIUM: 3.5 mmol/L (ref 3.5–5.1)
Sodium: 130 mmol/L — ABNORMAL LOW (ref 135–145)

## 2015-09-19 LAB — CBC WITH DIFFERENTIAL/PLATELET
BASOS ABS: 0.1 10*3/uL (ref 0–0.1)
BASOS PCT: 1 %
EOS PCT: 5 %
Eosinophils Absolute: 0.4 10*3/uL (ref 0–0.7)
HCT: 37.7 % — ABNORMAL LOW (ref 40.0–52.0)
Hemoglobin: 12.7 g/dL — ABNORMAL LOW (ref 13.0–18.0)
LYMPHS PCT: 14 %
Lymphs Abs: 1.2 10*3/uL (ref 1.0–3.6)
MCH: 31.6 pg (ref 26.0–34.0)
MCHC: 33.6 g/dL (ref 32.0–36.0)
MCV: 94.3 fL (ref 80.0–100.0)
Monocytes Absolute: 0.7 10*3/uL (ref 0.2–1.0)
Monocytes Relative: 9 %
NEUTROS ABS: 6.2 10*3/uL (ref 1.4–6.5)
Neutrophils Relative %: 71 %
PLATELETS: 164 10*3/uL (ref 150–440)
RBC: 4 MIL/uL — AB (ref 4.40–5.90)
RDW: 14.6 % — ABNORMAL HIGH (ref 11.5–14.5)
WBC: 8.5 10*3/uL (ref 3.8–10.6)

## 2015-09-19 LAB — TROPONIN I: TROPONIN I: 0.03 ng/mL (ref <0.031)

## 2015-09-19 MED ORDER — PREDNISONE 20 MG PO TABS: 40.0000 mg | ORAL_TABLET | Freq: Every day | ORAL | Status: DC

## 2015-09-19 MED ORDER — METHYLPREDNISOLONE SODIUM SUCC 125 MG IJ SOLR
125.0000 mg | Freq: Once | INTRAMUSCULAR | Status: AC
  Administered 2015-09-19: 125 mg via INTRAVENOUS
  Filled 2015-09-19: qty 2

## 2015-09-19 MED ORDER — IPRATROPIUM-ALBUTEROL 0.5-2.5 (3) MG/3ML IN SOLN
3.0000 mL | Freq: Once | RESPIRATORY_TRACT | Status: AC
  Administered 2015-09-19: 3 mL via RESPIRATORY_TRACT
  Filled 2015-09-19: qty 3

## 2015-09-19 NOTE — ED Notes (Signed)
Dialysis pt. Went today and was unable to complete treatment due to SOB. Denies pain

## 2015-09-19 NOTE — ED Notes (Signed)
Pt transported to xray 

## 2015-09-19 NOTE — ED Provider Notes (Signed)
[facility] Emergency Department Provider Note    ____________________________________________  Time seen: ~0840  I have reviewed the triage vital signs and the nursing notes.   HISTORY  Chief Complaint Shortness of Breath   History limited by: Not Limited   HPI Blake Herrera is a 60 y.o. male with history of end stage renal disease, on dialysis, COPD, who presents to the emergency department today because of shortness of breath. The patient states it started this morning. It did get worse during dialysis. It got severe enough that the patient had to stop his dialysis session. He states that he has not had any associated cough or chest pain. Denies fever. Does have albuterol inhaler at home that he uses. He is not sure if he currently has a pulmonologist.     Past Medical History  Diagnosis Date  . Asthma   . ESRD (end stage renal disease) (HCC)   . COPD, severe (HCC)   . GIB (gastrointestinal bleeding)   . OSA (obstructive sleep apnea)   . Anemia   . H. pylori infection   . Insomnia   . A-fib (HCC)   . Pulmonary HTN (HCC)   . Depression   . Tobacco abuse   . Pleural effusion   . Gout   . H/O pyelonephritis   . Pancreatitis   . Hypertension   . Dialysis patient (HCC)   . A-fib (HCC)   . Arrhythmia     a-fib    Patient Active Problem List   Diagnosis Date Noted  . COPD exacerbation (HCC) 06/22/2015  . Atrial fibrillation, unspecified 07/03/2014  . Smoker 07/03/2014  . Essential hypertension 07/03/2014  . History of GI bleed 07/03/2014  . Pleural effusion 07/03/2014  . End stage renal disease (HCC) 07/03/2014  . Dependence on hemodialysis (HCC) 07/03/2014  . Emphysema of lung (HCC) 07/03/2014  . Preop cardiovascular exam 07/03/2014    Past Surgical History  Procedure Laterality Date  . Left arm fistula      Current Outpatient Rx  Name  Route  Sig  Dispense  Refill  . albuterol (PROVENTIL HFA;VENTOLIN HFA) 108 (90 BASE) MCG/ACT  inhaler   Inhalation   Inhale 2 puffs into the lungs every 6 (six) hours as needed for wheezing or shortness of breath.         Marland Kitchen. azithromycin (ZITHROMAX) 250 MG tablet      1 table daily for 4 days   4 each   0   . calcium acetate (PHOSLO) 667 MG capsule   Oral   Take 1,334 mg by mouth 3 (three) times daily with meals.          Marland Kitchen. diltiazem (CARDIZEM CD) 120 MG 24 hr capsule   Oral   Take 1 capsule (120 mg total) by mouth 2 (two) times daily.   180 capsule   3   . furosemide (LASIX) 80 MG tablet   Oral   Take 80 mg by mouth 2 (two) times daily.         Marland Kitchen. lisinopril (PRINIVIL,ZESTRIL) 5 MG tablet   Oral   Take 5 mg by mouth daily.         . metoprolol succinate (TOPROL-XL) 50 MG 24 hr tablet   Oral   Take 50 mg by mouth at bedtime.         . predniSONE (DELTASONE) 10 MG tablet   Oral   Take 1 tablet (10 mg total) by mouth daily with breakfast.  60 mg PO x 2 days 50 mg PO x 2 days 40 mg PO x 2 days 30 mg PO x 2 days 20 mg PO x 2 days 10 mg PO x 2 days then stop   20 tablet   0     Label  & dispense according to the schedule below: ...   . sevelamer carbonate (RENVELA) 800 MG tablet   Oral   Take 1,600-2,400 mg by mouth 5 (five) times daily. Pt takes three capsules three times a day with meals and two capsules two times a day with snacks.         . tamsulosin (FLOMAX) 0.4 MG CAPS capsule   Oral   Take 0.4 mg by mouth at bedtime.            Allergies Review of patient's allergies indicates no known allergies.  Family History  Problem Relation Age of Onset  . Hypertension Mother   . Hyperlipidemia Mother   . Hypertension Father   . Hyperlipidemia Father   . Hypertension Brother     Social History Social History  Substance Use Topics  . Smoking status: Current Every Day Smoker -- 0.50 packs/day for 35 years    Types: Cigarettes  . Smokeless tobacco: Not on file  . Alcohol Use: No    Review of Systems  Constitutional: Negative for  fever. Cardiovascular: Negative for chest pain. Respiratory: Positive for shortness of breath. Gastrointestinal: Negative for abdominal pain, vomiting and diarrhea. Neurological: Negative for headaches, focal weakness or numbness.  10-point ROS otherwise negative.  ____________________________________________   PHYSICAL EXAM:  VITAL SIGNS: ED Triage Vitals  Enc Vitals Group     BP 09/19/15 0809 128/80 mmHg     Pulse Rate 09/19/15 0809 83     Resp 09/19/15 0809 24     Temp 09/19/15 0809 98 F (36.7 C)     Temp Source 09/19/15 0809 Oral     SpO2 09/19/15 0809 89 %     Weight 09/19/15 0809 200 lb (90.719 kg)     Height 09/19/15 0809 6' (1.829 m)   Constitutional: Alert and oriented. Well appearing and in no distress. Eyes: Conjunctivae are normal. PERRL. Normal extraocular movements. ENT   Head: Normocephalic and atraumatic.   Nose: No congestion/rhinnorhea.   Mouth/Throat: Mucous membranes are moist.   Neck: No stridor. Hematological/Lymphatic/Immunilogical: No cervical lymphadenopathy. Cardiovascular: Normal rate, regular rhythm.  No murmurs, rubs, or gallops. Respiratory: Normal respiratory effort without tachypnea nor retractions. Diffuse wheezing, worse on the right lung.  Gastrointestinal: Soft and nontender. No distention. There is no CVA tenderness. Genitourinary: Deferred Musculoskeletal: Normal range of motion in all extremities. No joint effusions.  No lower extremity tenderness nor edema. Neurologic:  Normal speech and language. No gross focal neurologic deficits are appreciated.  Skin:  Skin is warm, dry and intact. No rash noted. Psychiatric: Mood and affect are normal. Speech and behavior are normal. Patient exhibits appropriate insight and judgment.  ____________________________________________    LABS (pertinent positives/negatives)  Labs Reviewed  CBC WITH DIFFERENTIAL/PLATELET - Abnormal; Notable for the following:    RBC 4.00 (*)     Hemoglobin 12.7 (*)    HCT 37.7 (*)    RDW 14.6 (*)    All other components within normal limits  BASIC METABOLIC PANEL - Abnormal; Notable for the following:    Sodium 130 (*)    Chloride 97 (*)    BUN 41 (*)    Creatinine, Ser 7.18 (*)  Calcium 7.9 (*)    GFR calc non Af Amer 7 (*)    GFR calc Af Amer 9 (*)    All other components within normal limits  TROPONIN I    ____________________________________________   EKG  I, Phineas Semen, attending physician, personally viewed and interpreted this EKG  EKG Time: 0815 Rate: 85 Rhythm: atrial fibrillation Axis: normal Intervals: qtc 464 QRS: narrow, incomplete RBBB ST changes: no st elevation Impression: abnormal ekg   ____________________________________________    RADIOLOGY  CXR IMPRESSION: Stable left effusion. The opacity in the left infrahilar region is slightly more conspicuous in the interval, probably due to technique as there has been opacity in this region since at least December of 2015.  I, Teri Diltz, personally viewed and evaluated these images (plain radiographs) as part of my medical decision making. ____________________________________________   PROCEDURES  Procedure(s) performed: None  Critical Care performed: No  ____________________________________________   INITIAL IMPRESSION / ASSESSMENT AND PLAN / ED COURSE  Pertinent labs & imaging results that were available during my care of the patient were reviewed by me and considered in my medical decision making (see chart for details).  Patient presents with shortness of breath, has a history of COPD. Mild wheezing on initial exam. Chest x-ray without any signs for acute infiltrate. Patient states he felt better after steroids and breathing treatment. Will discharge home with steroids. Encourage primary care follow-up.  ____________________________________________   FINAL CLINICAL IMPRESSION(S) / ED DIAGNOSES  Final diagnoses:   COPD exacerbation (HCC)     Phineas Semen, MD 09/19/15 1128

## 2015-09-19 NOTE — Discharge Instructions (Signed)
Please seek medical attention for any high fevers, chest pain, shortness of breath, change in behavior, persistent vomiting, bloody stool or any other new or concerning symptoms. ° ° °Chronic Obstructive Pulmonary Disease Exacerbation °Chronic obstructive pulmonary disease (COPD) is a common lung problem. In COPD, the flow of air from the lungs is limited. COPD exacerbations are times that breathing gets worse and you need extra treatment. Without treatment they can be life threatening. If they happen often, your lungs can become more damaged. If your COPD gets worse, your doctor may treat you with: °· Medicines. °· Oxygen. °· Different ways to clear your airway, such as using a mask. °HOME CARE °· Do not smoke. °· Avoid tobacco smoke and other things that bother your lungs. °· If given, take your antibiotic medicine as told. Finish the medicine even if you start to feel better. °· Only take medicines as told by your doctor. °· Drink enough fluids to keep your pee (urine) clear or pale yellow (unless your doctor has told you not to). °· Use a cool mist machine (vaporizer). °· If you use oxygen or a machine that turns liquid medicine into a mist (nebulizer), continue to use them as told. °· Keep up with shots (vaccinations) as told by your doctor. °· Exercise regularly. °· Eat healthy foods. °· Keep all doctor visits as told. °GET HELP RIGHT AWAY IF: °· You are very short of breath and it gets worse. °· You have trouble talking. °· You have bad chest pain. °· You have blood in your spit (sputum). °· You have a fever. °· You keep throwing up (vomiting). °· You feel weak, or you pass out (faint). °· You feel confused. °· You keep getting worse. °MAKE SURE YOU: °· Understand these instructions. °· Will watch your condition. °· Will get help right away if you are not doing well or get worse. °  °This information is not intended to replace advice given to you by your health care provider. Make sure you discuss any  questions you have with your health care provider. °  °Document Released: 05/19/2011 Document Revised: 06/20/2014 Document Reviewed: 02/01/2013 °Elsevier Interactive Patient Education ©2016 [employer] ° °

## 2015-12-31 ENCOUNTER — Emergency Department: Payer: Medicare Other

## 2015-12-31 ENCOUNTER — Emergency Department
Admission: EM | Admit: 2015-12-31 | Discharge: 2015-12-31 | Disposition: A | Discharge status: Home / Self Care | Payer: Medicare Other | Attending: Emergency Medicine | Admitting: Emergency Medicine

## 2015-12-31 DIAGNOSIS — Z792 Long term (current) use of antibiotics: Secondary | ICD-10-CM | POA: Diagnosis not present

## 2015-12-31 DIAGNOSIS — Z79899 Other long term (current) drug therapy: Secondary | ICD-10-CM | POA: Diagnosis not present

## 2015-12-31 DIAGNOSIS — R0602 Shortness of breath: Secondary | ICD-10-CM | POA: Diagnosis present

## 2015-12-31 DIAGNOSIS — F17210 Nicotine dependence, cigarettes, uncomplicated: Secondary | ICD-10-CM | POA: Diagnosis not present

## 2015-12-31 DIAGNOSIS — F329 Major depressive disorder, single episode, unspecified: Secondary | ICD-10-CM | POA: Insufficient documentation

## 2015-12-31 DIAGNOSIS — N186 End stage renal disease: Secondary | ICD-10-CM | POA: Diagnosis not present

## 2015-12-31 DIAGNOSIS — J45909 Unspecified asthma, uncomplicated: Secondary | ICD-10-CM | POA: Insufficient documentation

## 2015-12-31 DIAGNOSIS — I120 Hypertensive chronic kidney disease with stage 5 chronic kidney disease or end stage renal disease: Secondary | ICD-10-CM | POA: Diagnosis not present

## 2015-12-31 DIAGNOSIS — I4891 Unspecified atrial fibrillation: Secondary | ICD-10-CM | POA: Insufficient documentation

## 2015-12-31 DIAGNOSIS — J441 Chronic obstructive pulmonary disease with (acute) exacerbation: Secondary | ICD-10-CM | POA: Diagnosis not present

## 2015-12-31 DIAGNOSIS — Z992 Dependence on renal dialysis: Secondary | ICD-10-CM | POA: Diagnosis not present

## 2015-12-31 LAB — BASIC METABOLIC PANEL
ANION GAP: 9 (ref 5–15)
BUN: 11 mg/dL (ref 6–20)
CALCIUM: 9.1 mg/dL (ref 8.9–10.3)
CHLORIDE: 97 mmol/L — AB (ref 101–111)
CO2: 29 mmol/L (ref 22–32)
Creatinine, Ser: 3.51 mg/dL — ABNORMAL HIGH (ref 0.61–1.24)
GFR calc non Af Amer: 18 mL/min — ABNORMAL LOW (ref >60)
GFR, EST AFRICAN AMERICAN: 20 mL/min — AB (ref >60)
Glucose, Bld: 86 mg/dL (ref 65–99)
POTASSIUM: 3.3 mmol/L — AB (ref 3.5–5.1)
Sodium: 135 mmol/L (ref 135–145)

## 2015-12-31 LAB — CBC
HCT: 38.7 % — ABNORMAL LOW (ref 40.0–52.0)
HEMOGLOBIN: 13.1 g/dL (ref 13.0–18.0)
MCH: 32.7 pg (ref 26.0–34.0)
MCHC: 33.8 g/dL (ref 32.0–36.0)
MCV: 96.8 fL (ref 80.0–100.0)
Platelets: 122 10*3/uL — ABNORMAL LOW (ref 150–440)
RBC: 4 MIL/uL — AB (ref 4.40–5.90)
RDW: 13.8 % (ref 11.5–14.5)
WBC: 5.5 10*3/uL (ref 3.8–10.6)

## 2015-12-31 MED ORDER — IPRATROPIUM-ALBUTEROL 0.5-2.5 (3) MG/3ML IN SOLN
3.0000 mL | Freq: Once | RESPIRATORY_TRACT | Status: AC
  Administered 2015-12-31: 3 mL via RESPIRATORY_TRACT
  Filled 2015-12-31: qty 3

## 2015-12-31 MED ORDER — ALBUTEROL SULFATE (2.5 MG/3ML) 0.083% IN NEBU: 5.0000 mg | INHALATION_SOLUTION | Freq: Once | RESPIRATORY_TRACT | Status: DC

## 2015-12-31 MED ORDER — PREDNISONE 20 MG PO TABS
60.0000 mg | ORAL_TABLET | Freq: Once | ORAL | Status: AC
  Administered 2015-12-31: 60 mg via ORAL
  Filled 2015-12-31: qty 3

## 2015-12-31 MED ORDER — ALBUTEROL SULFATE HFA 108 (90 BASE) MCG/ACT IN AERS: 2.0000 | INHALATION_SPRAY | Freq: Four times a day (QID) | RESPIRATORY_TRACT | Status: AC | PRN

## 2015-12-31 MED ORDER — PREDNISONE 20 MG PO TABS: 40.0000 mg | ORAL_TABLET | Freq: Every day | ORAL | Status: AC

## 2015-12-31 NOTE — ED Provider Notes (Signed)
Time Seen: Approximately 12:30  I have reviewed the triage notes  Chief Complaint: Shortness of Breath   History of Present Illness: Blake Herrera is a 60 y.o. male *who presents with shortness of breath after dialysis performed earlier today. Patient states he has a history of similar issues post dialysis and has a known history of asthma and COPD. Full dialysis treatment and had approximately 2 L of fluid removed. He denies any chest pain or feeling as of lightheadedness. He states he is currently out of his inhalers at home and prednisone works well for him.   Past Medical History  Diagnosis Date  . Asthma   . ESRD (end stage renal disease) (HCC)   . COPD, severe (HCC)   . GIB (gastrointestinal bleeding)   . OSA (obstructive sleep apnea)   . Anemia   . H. pylori infection   . Insomnia   . A-fib (HCC)   . Pulmonary HTN (HCC)   . Depression   . Tobacco abuse   . Pleural effusion   . Gout   . H/O pyelonephritis   . Pancreatitis   . Hypertension   . Dialysis patient (HCC)   . A-fib (HCC)   . Arrhythmia     a-fib    Patient Active Problem List   Diagnosis Date Noted  . COPD exacerbation (HCC) 06/22/2015  . Atrial fibrillation, unspecified 07/03/2014  . Smoker 07/03/2014  . Essential hypertension 07/03/2014  . History of GI bleed 07/03/2014  . Pleural effusion 07/03/2014  . End stage renal disease (HCC) 07/03/2014  . Dependence on hemodialysis (HCC) 07/03/2014  . Emphysema of lung (HCC) 07/03/2014  . Preop cardiovascular exam 07/03/2014    Past Surgical History  Procedure Laterality Date  . Left arm fistula      Past Surgical History  Procedure Laterality Date  . Left arm fistula      Current Outpatient Rx  Name  Route  Sig  Dispense  Refill  . albuterol (PROVENTIL HFA;VENTOLIN HFA) 108 (90 Base) MCG/ACT inhaler   Inhalation   Inhale 2 puffs into the lungs every 6 (six) hours as needed for wheezing or shortness of breath.   1 Inhaler   2   .  azithromycin (ZITHROMAX) 250 MG tablet      1 table daily for 4 days   4 each   0   . calcium acetate (PHOSLO) 667 MG capsule   Oral   Take 1,334 mg by mouth 3 (three) times daily with meals.          Marland Kitchen. diltiazem (CARDIZEM CD) 120 MG 24 hr capsule   Oral   Take 1 capsule (120 mg total) by mouth 2 (two) times daily.   180 capsule   3   . furosemide (LASIX) 80 MG tablet   Oral   Take 80 mg by mouth 2 (two) times daily.         Marland Kitchen. lisinopril (PRINIVIL,ZESTRIL) 5 MG tablet   Oral   Take 5 mg by mouth daily.         . metoprolol succinate (TOPROL-XL) 50 MG 24 hr tablet   Oral   Take 50 mg by mouth at bedtime.         . predniSONE (DELTASONE) 20 MG tablet   Oral   Take 2 tablets (40 mg total) by mouth daily.   10 tablet   0   . sevelamer carbonate (RENVELA) 800 MG tablet   Oral   Take  1,600-2,400 mg by mouth 5 (five) times daily. Pt takes three capsules three times a day with meals and two capsules two times a day with snacks.         . tamsulosin (FLOMAX) 0.4 MG CAPS capsule   Oral   Take 0.4 mg by mouth at bedtime.            Allergies:  Review of patient's allergies indicates no known allergies.  Family History: Family History  Problem Relation Age of Onset  . Hypertension Mother   . Hyperlipidemia Mother   . Hypertension Father   . Hyperlipidemia Father   . Hypertension Brother     Social History: Social History  Substance Use Topics  . Smoking status: Current Every Day Smoker -- 0.50 packs/day for 35 years    Types: Cigarettes  . Smokeless tobacco: None  . Alcohol Use: No     Review of Systems:   10 point review of systems was performed and was otherwise negative:  Constitutional: No fever Eyes: No visual disturbances ENT: No sore throat, ear pain Cardiac: No chest pain Respiratory: Shortness of breath has improved during his stay here in the emergency department. Abdomen: No abdominal pain, no vomiting, No diarrhea Endocrine: No  weight loss, No night sweats Extremities: No peripheral edema, cyanosis Skin: No rashes, easy bruising Neurologic: No focal weakness, trouble with speech or swollowing Urologic: Patient tolerated his dialysis today and does not put out a lot of urine on his own.   Physical Exam:  ED Triage Vitals  Enc Vitals Group     BP 12/31/15 1032 137/88 mmHg     Pulse Rate 12/31/15 1032 78     Resp 12/31/15 1032 20     Temp 12/31/15 1032 98.1 F (36.7 C)     Temp Source 12/31/15 1032 Oral     SpO2 12/31/15 1032 94 %     Weight 12/31/15 1032 200 lb (90.719 kg)     Height 12/31/15 1032 5\' 11"  (1.803 m)     Head Cir --      Peak Flow --      Pain Score 12/31/15 1216 0     Pain Loc --      Pain Edu? --      Excl. in GC? --     General: Awake , Alert , and Oriented times 3; GCS 15. No signs of respiratory distress such as upper respiratory retractions. Patient speaks in full complete sentences. Head: Normal cephalic , atraumatic Eyes: Pupils equal , round, reactive to light Nose/Throat: No nasal drainage, patent upper airway without erythema or exudate.  Neck: Supple, Full range of motion, No anterior adenopathy or palpable thyroid masses Lungs: Wheezing auscultated throughout all lung fields and symmetric without rhonchi or rales Heart: Regular rate, irregular rhythm without murmurs gallops or rubs Abdomen: Soft, non tender without rebound, guarding , or rigidity; bowel sounds positive and symmetric in all 4 quadrants. No organomegaly .        Extremities: 2 plus symmetric pulses. No edema, clubbing or cyanosis Neurologic: normal ambulation, Motor symmetric without deficits, sensory intact Skin: warm, dry, no rashes   Labs:   All laboratory work was reviewed including any pertinent negatives or positives listed below:  Labs Reviewed  CBC - Abnormal; Notable for the following:    RBC 4.00 (*)    HCT 38.7 (*)    Platelets 122 (*)    All other components within normal limits  BASIC  METABOLIC PANEL -  Abnormal; Notable for the following:    Potassium 3.3 (*)    Chloride 97 (*)    Creatinine, Ser 3.51 (*)    GFR calc non Af Amer 18 (*)    GFR calc Af Amer 20 (*)    All other components within normal limits  Laboratory work was reviewed in for the patient and his results are within normal limits  EKG:   ED ECG REPORT I, Jennye Moccasin, the attending physician, personally viewed and interpreted this ECG.  Date: 12/31/2015 EKG Time: 1039 Rate: 95 Rhythm: Atrial fibrillation with occasional premature conducted complexes QRS Axis: normal Intervals: normal ST/T Wave abnormalities: Nonspecific ST-T wave changes Conduction Disturbances: none Narrative Interpretation: unremarkable No acute ischemic changes noted   Radiology:      DG Chest 2 View (Final result) Result time: 12/31/15 11:13:47   Final result by Rad Results In Interface (12/31/15 11:13:47)   Narrative:   CLINICAL DATA: Shortness of breath. Chronic renal failure  EXAM: CHEST 2 VIEW  COMPARISON: September 19, 2015  FINDINGS: There is a chronic appearing left pleural effusion with areas of scarring and atelectasis in the left mid lower lung zones. There is no new opacity. Right lung is clear. Heart is upper normal in size with pulmonary vascularity within normal limits. No adenopathy. No bone lesions.  IMPRESSION: Chronic loculated left effusion with areas of scarring and atelectasis in the left mid lower lung zones. No new opacity. Right lung clear. Stable cardiac silhouette.   Electronically Signed By: Bretta Bang III M.D. On: 12/31/2015 11:13              I personally reviewed the radiologic studies     ED Course: * Patient received an albuterol nebulizer treatment with symptomatic improvement. Patient was started on prednisone and be discharged on the same. Repeat exam shows clearing of most of his wheezing at the apices though he still has some wheezing at  the bases. Chest x-ray laboratory work and felt were typical for this patient's history.   Assessment:  Acute exacerbation of chronic obstructive pulmonary disease Dialysis patient   Final Clinical Impression: *  Final diagnoses:  COPD exacerbation (HCC)     Plan: * Outpatient management Patient was advised to return immediately if condition worsens. Patient was advised to follow up with their primary care physician or other specialized physicians involved in their outpatient care. The patient and/or family member/power of attorney had laboratory results reviewed at the bedside. All questions and concerns were addressed and appropriate discharge instructions were distributed by the nursing staff.             Jennye Moccasin, MD 12/31/15 [id number]

## 2015-12-31 NOTE — ED Notes (Signed)
Pt states that after he finished dialysis, he was SOB for about an hour. No SOB at this time. Pt states that only time he is SOB is when he finishes dialysis. Denies CP. Pt alert and oriented X4, active, cooperative, pt in NAD. RR even and unlabored, color WNL.

## 2015-12-31 NOTE — Discharge Instructions (Signed)
Chronic Obstructive Pulmonary Disease °Chronic obstructive pulmonary disease (COPD) is a common lung condition in which airflow from the lungs is limited. COPD is a general term that can be used to describe many different lung problems that limit airflow, including both chronic bronchitis and emphysema. If you have COPD, your lung function will probably never return to normal, but there are measures you can take to improve lung function and make yourself feel better. °CAUSES  °· Smoking (common). °· Exposure to secondhand smoke. °· Genetic problems. °· Chronic inflammatory lung diseases or recurrent infections. °SYMPTOMS °· Shortness of breath, especially with physical activity. °· Deep, persistent (chronic) cough with a large amount of thick mucus. °· Wheezing. °· Rapid breaths (tachypnea). °· Gray or bluish discoloration (cyanosis) of the skin, especially in your fingers, toes, or lips. °· Fatigue. °· Weight loss. °· Frequent infections or episodes when breathing symptoms become much worse (exacerbations). °· Chest tightness. °DIAGNOSIS °Your health care provider will take a medical history and perform a physical examination to diagnose COPD. Additional tests for COPD may include: °· Lung (pulmonary) function tests. °· Chest X-ray. °· CT scan. °· Blood tests. °TREATMENT  °Treatment for COPD may include: °· Inhaler and nebulizer medicines. These help manage the symptoms of COPD and make your breathing more comfortable. °· Supplemental oxygen. Supplemental oxygen is only helpful if you have a low oxygen level in your blood. °· Exercise and physical activity. These are beneficial for nearly all people with COPD. °· Lung surgery or transplant. °· Nutrition therapy to gain weight, if you are underweight. °· Pulmonary rehabilitation. This may involve working with a team of health care providers and specialists, such as respiratory, occupational, and physical therapists. °HOME CARE INSTRUCTIONS °· Take all medicines  (inhaled or pills) as directed by your health care provider. °· Avoid over-the-counter medicines or cough syrups that dry up your airway (such as antihistamines) and slow down the elimination of secretions unless instructed otherwise by your health care provider. °· If you are a smoker, the most important thing that you can do is stop smoking. Continuing to smoke will cause further lung damage and breathing trouble. Ask your health care provider for help with quitting smoking. He or she can direct you to community resources or hospitals that provide support. °· Avoid exposure to irritants such as smoke, chemicals, and fumes that aggravate your breathing. °· Use oxygen therapy and pulmonary rehabilitation if directed by your health care provider. If you require home oxygen therapy, ask your health care provider whether you should purchase a pulse oximeter to measure your oxygen level at home. °· Avoid contact with individuals who have a contagious illness. °· Avoid extreme temperature and humidity changes. °· Eat healthy foods. Eating smaller, more frequent meals and resting before meals may help you maintain your strength. °· Stay active, but balance activity with periods of rest. Exercise and physical activity will help you maintain your ability to do things you want to do. °· Preventing infection and hospitalization is very important when you have COPD. Make sure to receive all the vaccines your health care provider recommends, especially the pneumococcal and influenza vaccines. Ask your health care provider whether you need a pneumonia vaccine. °· Learn and use relaxation techniques to manage stress. °· Learn and use controlled breathing techniques as directed by your health care provider. Controlled breathing techniques include: °¨ Pursed lip breathing. Start by breathing in (inhaling) through your nose for 1 second. Then, purse your lips as if you were   going to whistle and breathe out (exhale) through the  pursed lips for 2 seconds. °¨ Diaphragmatic breathing. Start by putting one hand on your abdomen just above your waist. Inhale slowly through your nose. The hand on your abdomen should move out. Then purse your lips and exhale slowly. You should be able to feel the hand on your abdomen moving in as you exhale. °· Learn and use controlled coughing to clear mucus from your lungs. Controlled coughing is a series of short, progressive coughs. The steps of controlled coughing are: °1. Lean your head slightly forward. °2. Breathe in deeply using diaphragmatic breathing. °3. Try to hold your breath for 3 seconds. °4. Keep your mouth slightly open while coughing twice. °5. Spit any mucus out into a tissue. °6. Rest and repeat the steps once or twice as needed. °SEEK MEDICAL CARE IF: °· You are coughing up more mucus than usual. °· There is a change in the color or thickness of your mucus. °· Your breathing is more labored than usual. °· Your breathing is faster than usual. °SEEK IMMEDIATE MEDICAL CARE IF: °· You have shortness of breath while you are resting. °· You have shortness of breath that prevents you from: °¨ Being able to talk. °¨ Performing your usual physical activities. °· You have chest pain lasting longer than 5 minutes. °· Your skin color is more cyanotic than usual. °· You measure low oxygen saturations for longer than 5 minutes with a pulse oximeter. °MAKE SURE YOU: °· Understand these instructions. °· Will watch your condition. °· Will get help right away if you are not doing well or get worse. °  °This information is not intended to replace advice given to you by your health care provider. Make sure you discuss any questions you have with your health care provider. °  °Document Released: 03/09/2005 Document Revised: 06/20/2014 Document Reviewed: 01/24/2013 °Elsevier Interactive Patient Education ©2016 [employer] ° ° °Please return immediately if condition worsens. Please contact her primary physician  or the physician you were given for referral. If you have any specialist physicians involved in her treatment and plan please also contact them. Thank you for using  [facility]. ° °

## 2016-02-29 ENCOUNTER — Encounter: Payer: Self-pay | Admitting: *Deleted

## 2016-02-29 ENCOUNTER — Emergency Department: Payer: Medicare Other

## 2016-02-29 ENCOUNTER — Inpatient Hospital Stay
Admission: EM | Admit: 2016-02-29 | Discharge: 2016-03-02 | DRG: 291 | Disposition: A | Discharge status: Home / Self Care | Payer: Medicare Other | Attending: Internal Medicine | Admitting: Internal Medicine

## 2016-02-29 DIAGNOSIS — Z79899 Other long term (current) drug therapy: Secondary | ICD-10-CM

## 2016-02-29 DIAGNOSIS — I4891 Unspecified atrial fibrillation: Secondary | ICD-10-CM | POA: Diagnosis present

## 2016-02-29 DIAGNOSIS — G4700 Insomnia, unspecified: Secondary | ICD-10-CM | POA: Diagnosis present

## 2016-02-29 DIAGNOSIS — R000 Tachycardia, unspecified: Secondary | ICD-10-CM | POA: Diagnosis present

## 2016-02-29 DIAGNOSIS — J811 Chronic pulmonary edema: Secondary | ICD-10-CM | POA: Diagnosis present

## 2016-02-29 DIAGNOSIS — Z992 Dependence on renal dialysis: Secondary | ICD-10-CM

## 2016-02-29 DIAGNOSIS — G4733 Obstructive sleep apnea (adult) (pediatric): Secondary | ICD-10-CM | POA: Diagnosis present

## 2016-02-29 DIAGNOSIS — F17210 Nicotine dependence, cigarettes, uncomplicated: Secondary | ICD-10-CM | POA: Diagnosis present

## 2016-02-29 DIAGNOSIS — R61 Generalized hyperhidrosis: Secondary | ICD-10-CM | POA: Diagnosis present

## 2016-02-29 DIAGNOSIS — J9601 Acute respiratory failure with hypoxia: Secondary | ICD-10-CM | POA: Diagnosis present

## 2016-02-29 DIAGNOSIS — M109 Gout, unspecified: Secondary | ICD-10-CM | POA: Diagnosis present

## 2016-02-29 DIAGNOSIS — N186 End stage renal disease: Secondary | ICD-10-CM | POA: Diagnosis present

## 2016-02-29 DIAGNOSIS — I132 Hypertensive heart and chronic kidney disease with heart failure and with stage 5 chronic kidney disease, or end stage renal disease: Secondary | ICD-10-CM | POA: Diagnosis present

## 2016-02-29 DIAGNOSIS — I5033 Acute on chronic diastolic (congestive) heart failure: Secondary | ICD-10-CM | POA: Diagnosis present

## 2016-02-29 DIAGNOSIS — N400 Enlarged prostate without lower urinary tract symptoms: Secondary | ICD-10-CM | POA: Diagnosis present

## 2016-02-29 DIAGNOSIS — I5041 Acute combined systolic (congestive) and diastolic (congestive) heart failure: Secondary | ICD-10-CM | POA: Diagnosis present

## 2016-02-29 DIAGNOSIS — Z8711 Personal history of peptic ulcer disease: Secondary | ICD-10-CM

## 2016-02-29 DIAGNOSIS — J441 Chronic obstructive pulmonary disease with (acute) exacerbation: Secondary | ICD-10-CM | POA: Diagnosis present

## 2016-02-29 DIAGNOSIS — Z8249 Family history of ischemic heart disease and other diseases of the circulatory system: Secondary | ICD-10-CM

## 2016-02-29 DIAGNOSIS — D631 Anemia in chronic kidney disease: Secondary | ICD-10-CM | POA: Diagnosis present

## 2016-02-29 DIAGNOSIS — Z716 Tobacco abuse counseling: Secondary | ICD-10-CM

## 2016-02-29 DIAGNOSIS — R0682 Tachypnea, not elsewhere classified: Secondary | ICD-10-CM | POA: Diagnosis present

## 2016-02-29 LAB — BASIC METABOLIC PANEL
ANION GAP: 13 (ref 5–15)
BUN: 45 mg/dL — ABNORMAL HIGH (ref 6–20)
CALCIUM: 8.1 mg/dL — AB (ref 8.9–10.3)
CO2: 25 mmol/L (ref 22–32)
CREATININE: 9.52 mg/dL — AB (ref 0.61–1.24)
Chloride: 90 mmol/L — ABNORMAL LOW (ref 101–111)
GFR, EST AFRICAN AMERICAN: 6 mL/min — AB (ref >60)
GFR, EST NON AFRICAN AMERICAN: 5 mL/min — AB (ref >60)
Glucose, Bld: 89 mg/dL (ref 65–99)
Potassium: 4.5 mmol/L (ref 3.5–5.1)
SODIUM: 128 mmol/L — AB (ref 135–145)

## 2016-02-29 LAB — CBC
HCT: 34.9 % — ABNORMAL LOW (ref 40.0–52.0)
HEMOGLOBIN: 12 g/dL — AB (ref 13.0–18.0)
MCH: 32.6 pg (ref 26.0–34.0)
MCHC: 34.5 g/dL (ref 32.0–36.0)
MCV: 94.4 fL (ref 80.0–100.0)
PLATELETS: 111 10*3/uL — AB (ref 150–440)
RBC: 3.69 MIL/uL — AB (ref 4.40–5.90)
RDW: 13.6 % (ref 11.5–14.5)
WBC: 8.2 10*3/uL (ref 3.8–10.6)

## 2016-02-29 LAB — TROPONIN I

## 2016-02-29 MED ORDER — ALBUTEROL SULFATE (2.5 MG/3ML) 0.083% IN NEBU
5.0000 mg | INHALATION_SOLUTION | Freq: Once | RESPIRATORY_TRACT | Status: AC
  Administered 2016-02-29: 5 mg via RESPIRATORY_TRACT
  Filled 2016-02-29: qty 6

## 2016-02-29 MED ORDER — IPRATROPIUM-ALBUTEROL 0.5-2.5 (3) MG/3ML IN SOLN
3.0000 mL | Freq: Once | RESPIRATORY_TRACT | Status: AC
  Administered 2016-02-29: 3 mL via RESPIRATORY_TRACT
  Filled 2016-02-29: qty 3

## 2016-02-29 MED ORDER — NITROGLYCERIN IN D5W 200-5 MCG/ML-% IV SOLN
0.0000 ug/min | Freq: Once | INTRAVENOUS | Status: DC
  Filled 2016-02-29: qty 250

## 2016-02-29 MED ORDER — NITROGLYCERIN 0.4 MG SL SUBL
SUBLINGUAL_TABLET | SUBLINGUAL | Status: AC
  Administered 2016-02-29: 0.4 mg
  Filled 2016-02-29: qty 1

## 2016-02-29 MED ORDER — DILTIAZEM HCL 100 MG IV SOLR
5.0000 mg/h | Freq: Once | INTRAVENOUS | Status: AC
  Administered 2016-02-29: 5 mg/h via INTRAVENOUS
  Filled 2016-02-29: qty 100

## 2016-02-29 MED ORDER — METHYLPREDNISOLONE SODIUM SUCC 125 MG IJ SOLR
125.0000 mg | Freq: Once | INTRAMUSCULAR | Status: AC
  Administered 2016-02-29: 125 mg via INTRAVENOUS
  Filled 2016-02-29: qty 2

## 2016-02-29 MED ORDER — MAGNESIUM SULFATE 2 GM/50ML IV SOLN
2.0000 g | Freq: Once | INTRAVENOUS | Status: AC
  Administered 2016-02-29: 2 g via INTRAVENOUS
  Filled 2016-02-29: qty 50

## 2016-02-29 NOTE — ED Provider Notes (Signed)
[facility] Emergency Department Provider Note    First MD Initiated Contact with Patient 02/29/16 1939     (approximate)  I have reviewed the triage vital signs and the nursing notes.   HISTORY  Chief Complaint Shortness of Breath    HPI Blake Herrera is a 60 y.o. male  history of atrial fibrillation as well as severe COPD presents with 2 days of worsening shortness of breath, wheezing and thick productive cough. Patient is also on dialysis. States he goes Tuesday Thursday Saturday.  Patient denies any chest pain at this time. States he is able to have some improvement with his home nebulizer treatments but ran out and they were no longer effective. Has not been on any antibiotics recently. Denies any worsening orthopnea. He does not wear home oxygen.   Past Medical History:  Diagnosis Date  . A-fib (HCC)   . A-fib (HCC)   . Anemia   . Arrhythmia    a-fib  . Asthma   . COPD, severe (HCC)   . Depression   . Dialysis patient (HCC)   . ESRD (end stage renal disease) (HCC)   . GIB (gastrointestinal bleeding)   . Gout   . H. pylori infection   . H/O pyelonephritis   . Hypertension   . Insomnia   . OSA (obstructive sleep apnea)   . Pancreatitis   . Pleural effusion   . Pulmonary HTN (HCC)   . Tobacco abuse     Patient Active Problem List   Diagnosis Date Noted  . Pulmonary edema 02/29/2016  . COPD exacerbation (HCC) 06/22/2015  . Atrial fibrillation, unspecified 07/03/2014  . Smoker 07/03/2014  . Essential hypertension 07/03/2014  . History of GI bleed 07/03/2014  . Pleural effusion 07/03/2014  . End stage renal disease (HCC) 07/03/2014  . Dependence on hemodialysis (HCC) 07/03/2014  . Emphysema of lung (HCC) 07/03/2014  . Preop cardiovascular exam 07/03/2014    Past Surgical History:  Procedure Laterality Date  . Left arm fistula      Prior to Admission medications   Medication Sig Start Date End Date Taking? Authorizing  Provider  albuterol (PROVENTIL HFA;VENTOLIN HFA) 108 (90 Base) MCG/ACT inhaler Inhale 2 puffs into the lungs every 6 (six) hours as needed for wheezing or shortness of breath. 12/31/15  Yes Jennye MoccasinBrian S Quigley, MD  calcium acetate (PHOSLO) 667 MG capsule Take 1,334 mg by mouth 3 (three) times daily with meals.    Yes Historical Provider, MD  diltiazem (CARDIZEM CD) 120 MG 24 hr capsule Take 1 capsule (120 mg total) by mouth 2 (two) times daily. 07/03/14  Yes Antonieta Ibaimothy J Gollan, MD  furosemide (LASIX) 80 MG tablet Take 80 mg by mouth 2 (two) times daily.   Yes Historical Provider, MD  lisinopril (PRINIVIL,ZESTRIL) 5 MG tablet Take 5 mg by mouth daily.   Yes Historical Provider, MD  metoprolol succinate (TOPROL-XL) 50 MG 24 hr tablet Take 50 mg by mouth at bedtime.   Yes Historical Provider, MD  sevelamer carbonate (RENVELA) 800 MG tablet Take 1,600-2,400 mg by mouth 5 (five) times daily. Pt takes three capsules three times a day with meals and two capsules two times a day with snacks.   Yes Historical Provider, MD  tamsulosin (FLOMAX) 0.4 MG CAPS capsule Take 0.4 mg by mouth at bedtime.    Yes Historical Provider, MD    Allergies Review of patient's allergies indicates no known allergies.  Family History  Problem Relation Age of  Onset  . Hypertension Mother   . Hyperlipidemia Mother   . Hypertension Father   . Hyperlipidemia Father   . Hypertension Brother     Social History Social History  Substance Use Topics  . Smoking status: Current Every Day Smoker    Packs/day: 0.50    Years: 35.00    Types: Cigarettes  . Smokeless tobacco: Not on file  . Alcohol use No    Review of Systems Patient denies headaches, rhinorrhea, blurry vision, numbness, shortness of breath, chest pain, edema, cough, abdominal pain, nausea, vomiting, diarrhea, dysuria, fevers, rashes or hallucinations unless otherwise stated above in HPI. ____________________________________________   PHYSICAL EXAM:  VITAL  SIGNS: Vitals:   02/29/16 2300 02/29/16 2330  BP: (!) 124/93 (!) 139/94  Pulse: 100   Resp:    Temp:      Constitutional: Alert and oriented. Moderate respiratory distress. Eyes: Conjunctivae are normal. PERRL. EOMI. Head: Atraumatic. Nose: No congestion/rhinnorhea. Mouth/Throat: Mucous membranes are moist.  Oropharynx non-erythematous. Neck: No stridor. Painless ROM. No cervical spine tenderness to palpation Hematological/Lymphatic/Immunilogical: No cervical lymphadenopathy. Cardiovascular: Normal rate, regular rhythm. Grossly normal heart sounds.  Good peripheral circulation. Respiratory: tachypnea.  No retractions. With diffuse wheezing and prolonged expiratory phase. Gastrointestinal: Soft and nontender. No distention. No abdominal bruits. No CVA tenderness.  Musculoskeletal: No lower extremity tenderness nor edema.  No joint effusions. Neurologic:  Normal speech and language. No gross focal neurologic deficits are appreciated. No gait instability. Skin:  Skin is warm, dry and intact. No rash noted. Psychiatric: Mood and affect are normal. Speech and behavior are normal.  ____________________________________________   LABS (all labs ordered are listed, but only abnormal results are displayed)  Results for orders placed or performed during the hospital encounter of 02/29/16 (from the past 24 hour(s))  Basic metabolic panel     Status: Abnormal   Collection Time: 02/29/16  5:11 PM  Result Value Ref Range   Sodium 128 (L) 135 - 145 mmol/L   Potassium 4.5 3.5 - 5.1 mmol/L   Chloride 90 (L) 101 - 111 mmol/L   CO2 25 22 - 32 mmol/L   Glucose, Bld 89 65 - 99 mg/dL   BUN 45 (H) 6 - 20 mg/dL   Creatinine, Ser 1.61 (H) 0.61 - 1.24 mg/dL   Calcium 8.1 (L) 8.9 - 10.3 mg/dL   GFR calc non Af Amer 5 (L) >60 mL/min   GFR calc Af Amer 6 (L) >60 mL/min   Anion gap 13 5 - 15  CBC     Status: Abnormal   Collection Time: 02/29/16  5:11 PM  Result Value Ref Range   WBC 8.2 3.8 - 10.6  K/uL   RBC 3.69 (L) 4.40 - 5.90 MIL/uL   Hemoglobin 12.0 (L) 13.0 - 18.0 g/dL   HCT 09.6 (L) 04.5 - 40.9 %   MCV 94.4 80.0 - 100.0 fL   MCH 32.6 26.0 - 34.0 pg   MCHC 34.5 32.0 - 36.0 g/dL   RDW 81.1 91.4 - 78.2 %   Platelets 111 (L) 150 - 440 K/uL  Troponin I     Status: None   Collection Time: 02/29/16  5:11 PM  Result Value Ref Range   Troponin I <0.03 <0.03 ng/mL   ____________________________________________  EKG My review and personal interpretation at Time: 17:10   Indication: sob  Rate: 110  Rhythm: afib with rvr, Axis: left Other: no acute ischemic changes ____________________________________________  RADIOLOGY  CXR with no consolidation ____________________________________________  PROCEDURES  Procedure(s) performed: none    Critical Care performed: yes CRITICAL CARE Performed by: Willy Eddy   Total critical care time: 54 minutes  Critical care time was exclusive of separately billable procedures and treating other patients.  Critical care was necessary to treat or prevent imminent or life-threatening deterioration.  Critical care was time spent personally by me on the following activities: development of treatment plan with patient and/or surrogate as well as nursing, discussions with consultants, evaluation of patient's response to treatment, examination of patient, obtaining history from patient or surrogate, ordering and performing treatments and interventions, ordering and review of laboratory studies, ordering and review of radiographic studies, pulse oximetry and re-evaluation of patient's condition.  ____________________________________________   INITIAL IMPRESSION / ASSESSMENT AND PLAN / ED COURSE  Pertinent labs & imaging results that were available during my care of the patient were reviewed by me and considered in my medical decision making (see chart for details).  DDX: Asthma, copd, CHF, pna, ptx, malignancy, Pe, anemia   Blake Herrera is a 60 y.o. who presents to the ED with Acute respiratory distress, tachypnea.  Patient arrives critically ill with complex past medical history including COPD, CHF and on end-stage renal disease. Is also with a history of A. fib and arrives in A. fib with RVR. No evidence of acute ischemia patient denies any chest pain. Exam initially consistent with COPD. We'll start with nebulizer treatments. Will check chest x-ray to evaluate for congestive heart failure.  The patient will be placed on continuous pulse oximetry and telemetry for monitoring.  Laboratory evaluation will be sent to evaluate for the above complaints.     Clinical Course  Comment By Time  Called to bedside as the patient was in respiratory extremis.  Patient diaphoretic and tripoding hypoxic on nasal cannula. Bedside ultrasound taken and shows worsening edema and the patient's diastolic blood pressure elevated 220. Patient brought emergently on BiPAP and started on oral nitroglycerin as well as an IV nitro drip.  Patient is also on chronic beta blocker for her A. fib. Due to his apparent COPD exacerbation we'll start him on a Cardizem. Willy Eddy, MD 09/18 2035  Patient stabilizing on BiPAP. Now rate controlled on Cardizem drip. Blood pressure with an ideal parameters. Patient admitted to hospitalist for further evaluation and management. Willy Eddy, MD 09/18 2336      ____________________________________________   FINAL CLINICAL IMPRESSION(S) / ED DIAGNOSES  Final diagnoses:  Acute respiratory failure with hypoxia (HCC)  COPD exacerbation (HCC)  Acute combined systolic and diastolic congestive heart failure (HCC)  ESRD (end stage renal disease) (HCC)  Atrial fibrillation with RVR (HCC)      NEW MEDICATIONS STARTED DURING THIS VISIT:  New Prescriptions   No medications on file     Note:  This document was prepared using Dragon voice recognition software and may include unintentional dictation  errors.    Willy Eddy, MD 03/01/16 [phone]

## 2016-02-29 NOTE — ED Notes (Signed)
Pt taken to room 5 via w/c; placed on card monitor and O2 at 3l/min via [place]; report given to Dr Roxan Hockeyobinson and care nurse Rosey Batheresa, RN

## 2016-02-29 NOTE — ED Notes (Signed)
Pt states he is a dialysis pt and for the past week he has been experiencing shortness of breath - he stated that he knew if he went to dialysis tomorrow that they would send him to the er so he went ahead and came tonight - noted expiratory wheezes in bilat lower lobes with diminished lungs sound in bilat upper lobes - pt skin is warm and dry - color is race appropriate - capillary refill less than 3 sec

## 2016-02-29 NOTE — ED Notes (Addendum)
Heart rate remains between 105-120 - increased Cardizem to 7.5 mg/hr per titration order - pt is sitting up on side of bed - respirations unlabored at this time on bi-pap - skin warm and dry - daughter at bedside

## 2016-02-29 NOTE — ED Notes (Addendum)
Repositioned bipap mask and straps for pt comfort - heart rate 89-100 will continue cardizem drip at 7.5mg /hr as ordered

## 2016-02-29 NOTE — ED Notes (Signed)
Pt on bipap at 35% o2 sat 100%

## 2016-02-29 NOTE — ED Notes (Addendum)
Pt reports SHOB and generalized muscle cramping; breath sounds diminished with expiratory wheezes; pt st hx COPD, afib and dialysis due tomorrow; charge nurse called for room assignment

## 2016-02-29 NOTE — ED Notes (Signed)
B P 139/89 after sl ntg x1 - Dr Roxan Hockeyobinson stated to hold ntg drip at this time

## 2016-02-29 NOTE — ED Triage Notes (Addendum)
States SOB for the past few weeks, states he especially notices the SOB when he finishes dialysis, states he has not missed a tx, states nasal congestion, cough, pt speaking in full clear sentences, pt awake and alert in no acute distress

## 2016-02-29 NOTE — ED Notes (Signed)
Called pharmacy to verify they had seen cardizem order

## 2016-02-29 NOTE — ED Notes (Addendum)
Pt c/o difficulty breathing - his respiratory rate has increased to 32 with increased respiratory effort - continues with bilat wheezing even after treatment - profuse sweating - sat 87% on 5l o2 - Dr Roxan Hockeyobinson notified - he order bipap - respiratory called for bipap - order given for ntg sl now and then start ntg drip at 25mics - BP 184/120

## 2016-03-01 ENCOUNTER — Encounter: Payer: Self-pay | Admitting: *Deleted

## 2016-03-01 LAB — COMPREHENSIVE METABOLIC PANEL
ALBUMIN: 3.7 g/dL (ref 3.5–5.0)
ALT: 17 U/L (ref 17–63)
AST: 30 U/L (ref 15–41)
Alkaline Phosphatase: 166 U/L — ABNORMAL HIGH (ref 38–126)
Anion gap: 15 (ref 5–15)
BUN: 51 mg/dL — AB (ref 6–20)
CHLORIDE: 91 mmol/L — AB (ref 101–111)
CO2: 21 mmol/L — AB (ref 22–32)
CREATININE: 10.04 mg/dL — AB (ref 0.61–1.24)
Calcium: 8 mg/dL — ABNORMAL LOW (ref 8.9–10.3)
GFR calc Af Amer: 6 mL/min — ABNORMAL LOW (ref >60)
GFR calc non Af Amer: 5 mL/min — ABNORMAL LOW (ref >60)
Glucose, Bld: 144 mg/dL — ABNORMAL HIGH (ref 65–99)
Potassium: 4.9 mmol/L (ref 3.5–5.1)
SODIUM: 127 mmol/L — AB (ref 135–145)
Total Bilirubin: 1 mg/dL (ref 0.3–1.2)
Total Protein: 7 g/dL (ref 6.5–8.1)

## 2016-03-01 LAB — CBC
HCT: 35.2 % — ABNORMAL LOW (ref 40.0–52.0)
HEMOGLOBIN: 12.1 g/dL — AB (ref 13.0–18.0)
MCH: 32.4 pg (ref 26.0–34.0)
MCHC: 34.4 g/dL (ref 32.0–36.0)
MCV: 94 fL (ref 80.0–100.0)
PLATELETS: 122 10*3/uL — AB (ref 150–440)
RBC: 3.75 MIL/uL — AB (ref 4.40–5.90)
RDW: 13.3 % (ref 11.5–14.5)
WBC: 6.4 10*3/uL (ref 3.8–10.6)

## 2016-03-01 LAB — MAGNESIUM: Magnesium: 1.6 mg/dL — ABNORMAL LOW (ref 1.7–2.4)

## 2016-03-01 LAB — GLUCOSE, CAPILLARY: Glucose-Capillary: 141 mg/dL — ABNORMAL HIGH (ref 65–99)

## 2016-03-01 LAB — PHOSPHORUS: Phosphorus: 5.6 mg/dL — ABNORMAL HIGH (ref 2.5–4.6)

## 2016-03-01 LAB — MRSA PCR SCREENING: MRSA by PCR: NEGATIVE

## 2016-03-01 MED ORDER — SODIUM CHLORIDE 0.9 % IV SOLN
250.0000 mL | INTRAVENOUS | Status: DC | PRN
Start: 2016-03-01 — End: 2016-03-02

## 2016-03-01 MED ORDER — FUROSEMIDE 40 MG PO TABS
80.0000 mg | ORAL_TABLET | Freq: Two times a day (BID) | ORAL | Status: DC
  Administered 2016-03-01 – 2016-03-02 (×3): 80 mg via ORAL
  Filled 2016-03-01 (×3): qty 2

## 2016-03-01 MED ORDER — ACETAMINOPHEN 650 MG RE SUPP
650.0000 mg | Freq: Four times a day (QID) | RECTAL | Status: DC | PRN
Start: 2016-03-01 — End: 2016-03-02

## 2016-03-01 MED ORDER — ENOXAPARIN SODIUM 40 MG/0.4ML ~~LOC~~ SOLN: 30.0000 mg | SUBCUTANEOUS | Status: DC

## 2016-03-01 MED ORDER — DILTIAZEM HCL ER COATED BEADS 120 MG PO CP24
120.0000 mg | ORAL_CAPSULE | Freq: Two times a day (BID) | ORAL | Status: DC
  Administered 2016-03-01 – 2016-03-02 (×4): 120 mg via ORAL
  Filled 2016-03-01 (×4): qty 1

## 2016-03-01 MED ORDER — ONDANSETRON HCL 4 MG PO TABS: 4.0000 mg | ORAL_TABLET | Freq: Four times a day (QID) | ORAL | Status: DC | PRN

## 2016-03-01 MED ORDER — METHYLPREDNISOLONE SODIUM SUCC 125 MG IJ SOLR
60.0000 mg | Freq: Four times a day (QID) | INTRAMUSCULAR | Status: DC
  Administered 2016-03-01 (×2): 60 mg via INTRAVENOUS
  Filled 2016-03-01 (×2): qty 2

## 2016-03-01 MED ORDER — METHYLPREDNISOLONE SODIUM SUCC 125 MG IJ SOLR
60.0000 mg | Freq: Three times a day (TID) | INTRAMUSCULAR | Status: DC
  Administered 2016-03-01 – 2016-03-02 (×3): 60 mg via INTRAVENOUS
  Filled 2016-03-01 (×3): qty 2

## 2016-03-01 MED ORDER — TAMSULOSIN HCL 0.4 MG PO CAPS
0.4000 mg | ORAL_CAPSULE | Freq: Every day | ORAL | Status: DC
  Administered 2016-03-01: 0.4 mg via ORAL
  Filled 2016-03-01: qty 1

## 2016-03-01 MED ORDER — ZOLPIDEM TARTRATE 5 MG PO TABS: 5.0000 mg | ORAL_TABLET | Freq: Every evening | ORAL | Status: DC | PRN

## 2016-03-01 MED ORDER — TROLAMINE SALICYLATE 10 % EX CREA
TOPICAL_CREAM | Freq: Two times a day (BID) | CUTANEOUS | Status: DC | PRN
  Administered 2016-03-01 (×2): 1 via TOPICAL
  Filled 2016-03-01 (×2): qty 85

## 2016-03-01 MED ORDER — BISACODYL 5 MG PO TBEC
5.0000 mg | DELAYED_RELEASE_TABLET | Freq: Every day | ORAL | Status: DC | PRN
  Administered 2016-03-02: 5 mg via ORAL
  Filled 2016-03-01: qty 1

## 2016-03-01 MED ORDER — HEPARIN SODIUM (PORCINE) 5000 UNIT/ML IJ SOLN
5000.0000 [IU] | Freq: Two times a day (BID) | INTRAMUSCULAR | Status: DC
  Administered 2016-03-01 – 2016-03-02 (×3): 5000 [IU] via SUBCUTANEOUS
  Filled 2016-03-01 (×3): qty 1

## 2016-03-01 MED ORDER — IPRATROPIUM-ALBUTEROL 0.5-2.5 (3) MG/3ML IN SOLN
3.0000 mL | Freq: Four times a day (QID) | RESPIRATORY_TRACT | Status: DC | PRN
  Administered 2016-03-01: 3 mL via RESPIRATORY_TRACT

## 2016-03-01 MED ORDER — HYDROCODONE-ACETAMINOPHEN 5-325 MG PO TABS
1.0000 | ORAL_TABLET | ORAL | Status: DC | PRN
  Administered 2016-03-01 (×2): 2 via ORAL
  Administered 2016-03-02: 1 via ORAL
  Filled 2016-03-01 (×2): qty 2
  Filled 2016-03-01: qty 1

## 2016-03-01 MED ORDER — SEVELAMER CARBONATE 800 MG PO TABS
1600.0000 mg | ORAL_TABLET | Freq: Every day | ORAL | Status: DC
  Administered 2016-03-01 (×2): 1600 mg via ORAL
  Administered 2016-03-01: 2400 mg via ORAL
  Administered 2016-03-01 – 2016-03-02 (×3): 1600 mg via ORAL
  Administered 2016-03-02: 2400 mg via ORAL
  Filled 2016-03-01: qty 2
  Filled 2016-03-01 (×2): qty 1
  Filled 2016-03-01 (×3): qty 3
  Filled 2016-03-01 (×2): qty 2

## 2016-03-01 MED ORDER — SODIUM CHLORIDE 0.9% FLUSH
3.0000 mL | Freq: Two times a day (BID) | INTRAVENOUS | Status: DC
  Administered 2016-03-01 – 2016-03-02 (×3): 3 mL via INTRAVENOUS

## 2016-03-01 MED ORDER — IPRATROPIUM-ALBUTEROL 0.5-2.5 (3) MG/3ML IN SOLN
RESPIRATORY_TRACT | Status: AC
  Filled 2016-03-01: qty 3

## 2016-03-01 MED ORDER — SODIUM CHLORIDE 0.9% FLUSH
3.0000 mL | Freq: Two times a day (BID) | INTRAVENOUS | Status: DC
  Administered 2016-03-01 – 2016-03-02 (×2): 3 mL via INTRAVENOUS

## 2016-03-01 MED ORDER — DILTIAZEM HCL 100 MG IV SOLR
5.0000 mg/h | INTRAVENOUS | Status: DC
  Administered 2016-03-01: 7.5 mg/h via INTRAVENOUS
  Filled 2016-03-01: qty 100

## 2016-03-01 MED ORDER — MAGNESIUM CITRATE PO SOLN
1.0000 | Freq: Once | ORAL | Status: DC | PRN
  Filled 2016-03-01: qty 296

## 2016-03-01 MED ORDER — ACETAMINOPHEN 325 MG PO TABS: 650.0000 mg | ORAL_TABLET | Freq: Four times a day (QID) | ORAL | Status: DC | PRN

## 2016-03-01 MED ORDER — SENNOSIDES-DOCUSATE SODIUM 8.6-50 MG PO TABS: 1.0000 | ORAL_TABLET | Freq: Every evening | ORAL | Status: DC | PRN

## 2016-03-01 MED ORDER — ONDANSETRON HCL 4 MG/2ML IJ SOLN: 4.0000 mg | Freq: Four times a day (QID) | INTRAMUSCULAR | Status: DC | PRN

## 2016-03-01 MED ORDER — IPRATROPIUM-ALBUTEROL 0.5-2.5 (3) MG/3ML IN SOLN
3.0000 mL | Freq: Four times a day (QID) | RESPIRATORY_TRACT | Status: DC | PRN
  Administered 2016-03-02: 3 mL via RESPIRATORY_TRACT

## 2016-03-01 MED ORDER — LISINOPRIL 5 MG PO TABS
5.0000 mg | ORAL_TABLET | Freq: Every day | ORAL | Status: DC
  Administered 2016-03-02: 5 mg via ORAL
  Filled 2016-03-01 (×2): qty 1

## 2016-03-01 MED ORDER — IPRATROPIUM-ALBUTEROL 0.5-2.5 (3) MG/3ML IN SOLN
3.0000 mL | Freq: Four times a day (QID) | RESPIRATORY_TRACT | Status: DC
  Administered 2016-03-01 – 2016-03-02 (×4): 3 mL via RESPIRATORY_TRACT
  Filled 2016-03-01 (×4): qty 3

## 2016-03-01 MED ORDER — SODIUM CHLORIDE 0.9% FLUSH: 3.0000 mL | INTRAVENOUS | Status: DC | PRN

## 2016-03-01 MED ORDER — METOPROLOL SUCCINATE ER 50 MG PO TB24
50.0000 mg | ORAL_TABLET | Freq: Every day | ORAL | Status: DC
  Administered 2016-03-01: 50 mg via ORAL
  Filled 2016-03-01: qty 1

## 2016-03-01 MED ORDER — CALCIUM ACETATE (PHOS BINDER) 667 MG PO CAPS
1334.0000 mg | ORAL_CAPSULE | Freq: Three times a day (TID) | ORAL | Status: DC
  Administered 2016-03-01 – 2016-03-02 (×5): 1334 mg via ORAL
  Filled 2016-03-01 (×5): qty 2

## 2016-03-01 NOTE — H&P (Signed)
[facility] - [place] @ [facility] Admission History and Physical [employer], D.O.  ---------------------------------------------------------------------------------------------------------------------   PATIENT NAME: Blake Herrera MR#: [id number] DATE OF BIRTH: 1956-05-09 DATE OF ADMISSION: 02/29/2016 PRIMARY CARE PHYSICIAN: [facility]  REQUESTING/REFERRING PHYSICIAN: ED Dr. Roxan Hockey  CHIEF COMPLAINT: Chief Complaint  Patient presents with  . Shortness of Breath    HISTORY OF PRESENT ILLNESS: Londell Noll is a 60 y.o. male with a known history of ESRD on HD (Tuesday Thursday Saturday), COPD, A. fib, CHF Presented to the emergency department with a complaint of shortness of breath. Patient's daughter states that recently following dialysis he has worsening shortness of breath. His symptoms have been worsening over the past few weeks despite compliance with dialysis and medications. In the emergency department he was found to have expiratory wheezes and was placed on nebulizer treatments. Shortly after his arrival he became acutely short of breath with tachycardia, tachypnea, diaphoresis and O2 sats of 87% on 5 L of oxygen. He was also found to be in A. fib with RVR Patient was placed on BiPAP and Cardizem drip in the emergency department and quickly improved.  On my exam patient is resting comfortably, reports that his shortness of breath is improved with BiPAP. He denies any chest pain, shortness of breath, palpitations. He denies any recent change in status. Patient has been taking medication as prescribed and there has been no recent change in medication or diet.  There has been no recent illness, travel or sick contacts.  He has completed his dialysis most recently on Saturday which was 2 days ago  Patient denies fevers/chills, weakness, dizziness, chest pain, shortness of breath, N/V/C/D, abdominal pain, dysuria/frequency, changes in mental status.   EMS/ED COURSE:   As above   PAST MEDICAL HISTORY: Past Medical History:  Diagnosis Date  . A-fib (HCC)   . A-fib (HCC)   . Anemia   . Arrhythmia    a-fib  . Asthma   . COPD, severe (HCC)   . Depression   . Dialysis patient (HCC)   . ESRD (end stage renal disease) (HCC)   . GIB (gastrointestinal bleeding)   . Gout   . H. pylori infection   . H/O pyelonephritis   . Hypertension   . Insomnia   . OSA (obstructive sleep apnea)   . Pancreatitis   . Pleural effusion   . Pulmonary HTN (HCC)   . Tobacco abuse       PAST SURGICAL HISTORY: Past Surgical History:  Procedure Laterality Date  . Left arm fistula        SOCIAL HISTORY: Social History  Substance Use Topics  . Smoking status: Current Every Day Smoker    Packs/day: 0.50    Years: 35.00    Types: Cigarettes  . Smokeless tobacco: Not on file  . Alcohol use No      FAMILY HISTORY: Family History  Problem Relation Age of Onset  . Hypertension Mother   . Hyperlipidemia Mother   . Hypertension Father   . Hyperlipidemia Father   . Hypertension Brother      MEDICATIONS AT HOME: Prior to Admission medications   Medication Sig Start Date End Date Taking? Authorizing Provider  albuterol (PROVENTIL HFA;VENTOLIN HFA) 108 (90 Base) MCG/ACT inhaler Inhale 2 puffs into the lungs every 6 (six) hours as needed for wheezing or shortness of breath. 12/31/15  Yes Jennye Moccasin, MD  calcium acetate (PHOSLO) 667 MG capsule Take 1,334 mg by mouth 3 (three) times daily  with meals.    Yes Historical Provider, MD  diltiazem (CARDIZEM CD) 120 MG 24 hr capsule Take 1 capsule (120 mg total) by mouth 2 (two) times daily. 07/03/14  Yes Antonieta Iba, MD  furosemide (LASIX) 80 MG tablet Take 80 mg by mouth 2 (two) times daily.   Yes Historical Provider, MD  lisinopril (PRINIVIL,ZESTRIL) 5 MG tablet Take 5 mg by mouth daily.   Yes Historical Provider, MD  metoprolol succinate (TOPROL-XL) 50 MG 24 hr tablet Take 50 mg by mouth at bedtime.   Yes  Historical Provider, MD  sevelamer carbonate (RENVELA) 800 MG tablet Take 1,600-2,400 mg by mouth 5 (five) times daily. Pt takes three capsules three times a day with meals and two capsules two times a day with snacks.   Yes Historical Provider, MD  tamsulosin (FLOMAX) 0.4 MG CAPS capsule Take 0.4 mg by mouth at bedtime.    Yes Historical Provider, MD      DRUG ALLERGIES: No Known Allergies   REVIEW OF SYSTEMS: CONSTITUTIONAL: No fever/chills, fatigue, weakness, weight gain/loss, headache EYES: No blurry or double vision. ENT: No tinnitus, postnasal drip, redness or soreness of the oropharynx. RESPIRATORY: Positive cough, wheeze, dyspnea. CARDIOVASCULAR: No chest pain, orthopnea, palpitations, syncope. GASTROINTESTINAL: No nausea, vomiting, constipation, diarrhea, abdominal pain, hematemesis, melena or hematochezia. GENITOURINARY: No dysuria or hematuria. ENDOCRINE: No polyuria or nocturia. No heat or cold intolerance. HEMATOLOGY: No anemia, bruising, bleeding. INTEGUMENTARY: No rashes, ulcers, lesions. MUSCULOSKELETAL: No arthritis, gout. Positive lower extremity edema NEUROLOGIC: No numbness, tingling, weakness or ataxia. No seizure-type activity. PSYCHIATRIC: No anxiety, depression, insomnia.  PHYSICAL EXAMINATION: VITAL SIGNS: Blood pressure 125/88, pulse (!) 108, temperature 98.6 F (37 C), temperature source Oral, resp. rate 20, height 6' (1.829 m), weight 90.7 kg (200 lb), SpO2 90 %.  GENERAL: 60 y.o.-year-old black male patient, well-developed, well-nourished lying in the bed in no acute distress.  Pleasant and cooperative.   HEENT: Head atraumatic, normocephalic. Pupils equal, round, reactive to light and accommodation. No scleral icterus. Extraocular muscles intact. Nares are patent. Oropharynx is clear. Mucus membranes moist. NECK: Supple, full range of motion. No JVD, no bruit heard. No thyroid enlargement, no tenderness, no cervical lymphadenopathy. CHEST: Diffuse  wheezing and bibasilar crackles. No rales, rhonchi. No use of accessory muscles of respiration.  No reproducible chest wall tenderness.  CARDIOVASCULAR: S1, S2 normal. No murmurs, rubs, or gallops. Cap refill <2 seconds. ABDOMEN: Soft, nontender, nondistended. No rebound, guarding, rigidity. Normoactive bowel sounds present in all four quadrants. No organomegaly or mass. EXTREMITIES: Full range of motion. No pedal edema, cyanosis, or clubbing. NEUROLOGIC: Cranial nerves II through XII are grossly intact with no focal sensorimotor deficit. Muscle strength 5/5 in all extremities. Sensation intact. Gait not checked. PSYCHIATRIC: The patient is alert and oriented x 3. Normal affect, mood, thought content. SKIN: Warm, dry, and intact without obvious rash, lesion, or ulcer.  LABORATORY PANEL:  CBC  Recent Labs Lab 02/29/16 1711  WBC 8.2  HGB 12.0*  HCT 34.9*  PLT 111*   ----------------------------------------------------------------------------------------------------------------- Chemistries  Recent Labs Lab 02/29/16 1711  NA 128*  K 4.5  CL 90*  CO2 25  GLUCOSE 89  BUN 45*  CREATININE 9.52*  CALCIUM 8.1*   ------------------------------------------------------------------------------------------------------------------ Cardiac Enzymes  Recent Labs Lab 02/29/16 1711  TROPONINI <0.03   ------------------------------------------------------------------------------------------------------------------  RADIOLOGY: Dg Chest 2 View  Result Date: 02/29/2016 CLINICAL DATA:  Shortness of breath for the past few weeks EXAM: CHEST  2 VIEW COMPARISON:  12/31/2015 FINDINGS: Chronic small  left pleural effusion and/or pleural thickening with associated linear parenchymal scarring. Chronic cardiomegaly. No edema or air bronchogram. Presumed nipple shadow over the peripheral right lower chest. IMPRESSION: 1. No acute finding.  Stable compared to 12/31/2015. 2. Chronic small left pleural  effusion and basilar scarring. Electronically Signed   By: Marnee SpringJonathon  Watts M.D.   On: 02/29/2016 17:59    EKG: Atrial fibrillation with rapid ventricular response at 110 bpm, left axis deviation and nonspecific ST and T wave changes  IMPRESSION AND PLAN:  This is a 60 y.o. male with a history of end-stage renal disease on hemodialysis, COPD, CHF, A. fib now being admitted with: 1. Acute hypoxic respiratory failure secondary to pulmonary edema will admit to stepdown for continued BiPAP and oxygen therapy, nebulizers as needed. 2. COPD exacerbation-steroids and DuoNeb as well as oxygen therapy 3. Atrial fibrillation with rapid ventricular response-patient now rate controlled on Cardizem drip. We'll continue by mouth Cardizem. 4. Acute systolic and diastolic congestive heart failure-patient is due for dialysis tomorrow. Continue Lasix 80 mg [facility] 5. History of end-stage renal disease on hemodialysis-continue PhosLo and Renvela 6. History of BPH-continue Flomax 7. History of hypertension-continue lisinopril and Toprol   Diet/Nutrition: Renal with fluid restriction Fluids: Hep-Lock DVT Px: Lovenox, SCDs and early ambulation Code Status: Full  All the records are reviewed and case discussed with ED provider. Management plans discussed with the patient and/or family who express understanding and agree with plan of care.   TOTAL TIME TAKING CARE OF THIS PATIENT: 60 minutes.   Avanni Turnbaugh D.O. on 03/01/2016 at 2:19 AM Between 7am to 6pm - Pager - [phone] After 6pm go to [email or web address] - [email or web address] [email or web address] [facility] [place] [facility] Office [phone] CC: Primary care physician; [facility]     Note: This dictation was prepared with Dragon dictation along with smaller phrase technology. Any transcriptional errors that result from this process are unintentional.

## 2016-03-01 NOTE — Progress Notes (Signed)
Hemodialysis pre-assessment 

## 2016-03-01 NOTE — Progress Notes (Signed)
Hemodialysis Initiated without issue

## 2016-03-01 NOTE — Progress Notes (Signed)
Subjective:   Patient known to [place] from previous admissions Presents with wheezing and SOB Required NIPPV overnight   Objective:  Vital signs in last 24 hours:  Temp:  [96.6 F (35.9 C)-98.6 F (37 C)] 96.6 F (35.9 C) (09/19 0900) Pulse Rate:  [31-143] 50 (09/19 1000) Resp:  [19-40] 23 (09/19 1000) BP: (104-142)/(62-99) 108/72 (09/19 1000) SpO2:  [88 %-100 %] 95 % (09/19 1000) Weight:  [90.7 kg (200 lb)-93.7 kg (206 lb 9.1 oz)] 93.7 kg (206 lb 9.1 oz) (09/19 0400)  Weight change:  Filed Weights   02/29/16 1705 03/01/16 0400  Weight: 90.7 kg (200 lb) 93.7 kg (206 lb 9.1 oz)    Intake/Output:    Intake/Output Summary (Last 24 hours) at 03/01/16 1039 Last data filed at 03/01/16 0600  Gross per 24 hour  Intake            297.5 ml  Output                0 ml  Net            297.5 ml     Physical Exam: General: NAD, laying in bed  HEENT Anicteric, moist mucus membranes  Neck + JVD  Pulm/lungs B/l diffuse rhonchi and wheezing  CVS/Heart Irregular, A Fib on telemetry  Abdomen:  Soft, NT  Extremities: Trace peripheral edema  Neurologic: Alert, oreiented  Skin: No acute rashes  Access: AVF       Basic Metabolic Panel:   Recent Labs Lab 02/29/16 1711 03/01/16 0400  NA 128* 127*  K 4.5 4.9  CL 90* 91*  CO2 25 21*  GLUCOSE 89 144*  BUN 45* 51*  CREATININE 9.52* 10.04*  CALCIUM 8.1* 8.0*  MG 1.6*  --   PHOS  --  5.6*     CBC:  Recent Labs Lab 02/29/16 1711 03/01/16 0400  WBC 8.2 6.4  HGB 12.0* 12.1*  HCT 34.9* 35.2*  MCV 94.4 94.0  PLT 111* 122*      Microbiology:  Recent Results (from the past 720 hour(s))  MRSA PCR Screening     Status: None   Collection Time: 03/01/16  3:41 AM  Result Value Ref Range Status   MRSA by PCR NEGATIVE NEGATIVE Final    Comment:        The GeneXpert MRSA Assay (FDA approved for NASAL specimens only), is one component of a comprehensive MRSA colonization surveillance program. It is not intended to  diagnose MRSA infection nor to guide or monitor treatment for MRSA infections.     Coagulation Studies: No results for input(s): LABPROT, INR in the last 72 hours.  Urinalysis: No results for input(s): COLORURINE, LABSPEC, PHURINE, GLUCOSEU, HGBUR, BILIRUBINUR, KETONESUR, PROTEINUR, UROBILINOGEN, NITRITE, LEUKOCYTESUR in the last 72 hours.  Invalid input(s): APPERANCEUR    Imaging: Dg Chest 2 View  Result Date: 02/29/2016 CLINICAL DATA:  Shortness of breath for the past few weeks EXAM: CHEST  2 VIEW COMPARISON:  12/31/2015 FINDINGS: Chronic small left pleural effusion and/or pleural thickening with associated linear parenchymal scarring. Chronic cardiomegaly. No edema or air bronchogram. Presumed nipple shadow over the peripheral right lower chest. IMPRESSION: 1. No acute finding.  Stable compared to 12/31/2015. 2. Chronic small left pleural effusion and basilar scarring. Electronically Signed   By: Marnee Spring M.D.   On: 02/29/2016 17:59     Medications:   . diltiazem (CARDIZEM) infusion 7.5 mg/hr (03/01/16 0416)   . calcium acetate  1,334 mg Oral TID WC  .  diltiazem  120 mg Oral BID  . furosemide  80 mg Oral BID  . heparin subcutaneous  5,000 Units Subcutaneous Q12H  . ipratropium-albuterol      . lisinopril  5 mg Oral Daily  . methylPREDNISolone (SOLU-MEDROL) injection  60 mg Intravenous Q6H  . metoprolol succinate  50 mg Oral QHS  . nitroGLYCERIN  0-200 mcg/min Intravenous Once  . sevelamer carbonate  1,600-2,400 mg Oral 5 X Daily  . sodium chloride flush  3 mL Intravenous Q12H  . sodium chloride flush  3 mL Intravenous Q12H  . tamsulosin  0.4 mg Oral QHS   sodium chloride, acetaminophen **OR** acetaminophen, bisacodyl, HYDROcodone-acetaminophen, ipratropium-albuterol, magnesium citrate, ondansetron **OR** ondansetron (ZOFRAN) IV, senna-docusate, sodium chloride flush, zolpidem  Assessment/ Plan:  60 y.o. male with end-stage renal disease, COPD, history of duodenal  ulcer with GI bleed, obstructive sleep apnea, anemia, pulmonary hypertension, depression, history of loculated left pleural effusion in July 2015, gout, prior history of pancreatitis  1. End-stage renal disease. [place] dialysis. Tuesday/ Thursday/Saturday Will arrange for HD this admission HD today. UF as tolerated. Goal ~3 kg per patient request  2. Atrial fibrillation   Plan as per internal medicine  3. Anemia of chronic kidney disease Hemoglobin acceptable Hold procrit as Hgb > 12  4. Shortness of breath Probably multifactorial including COPD exacerbation, pulm edema counseled patient to quit smoking (currently 0.5 ppd)     LOS: 1 Blake Herrera 9/19/201710:39 AM

## 2016-03-01 NOTE — Progress Notes (Signed)
Post Hemodialysis assessment  

## 2016-03-01 NOTE — ED Notes (Signed)
Pt daughter agitated that pt has had bipap on for so long - pt states he is having starting to feel anxious due to bipap and either we can remove or he will - discussed with charge nurse and agreed upon to remove pt from bipap and place on o2 via n/c at 4L - discussed with pt the importance of the o2 to help him breath -  pt agreed to o2 via n/c at this time

## 2016-03-01 NOTE — Progress Notes (Signed)
[facility] [place] [facility]   PATIENT NAME: Blake Herrera    MR#:  [id number]  DATE OF BIRTH:  1955/10/08  SUBJECTIVE:   Came in with increasing sob and found to be in Afib with RVR. Placed on BIPAP REVIEW OF SYSTEMS:   Review of Systems  Constitutional: Negative for chills, fever and weight loss.  HENT: Negative for ear discharge, ear pain and nosebleeds.   Eyes: Negative for blurred vision, pain and discharge.  Respiratory: Positive for shortness of breath. Negative for sputum production, wheezing and stridor.   Cardiovascular: Positive for chest pain and palpitations. Negative for orthopnea and PND.  Gastrointestinal: Negative for abdominal pain, diarrhea, nausea and vomiting.  Genitourinary: Negative for frequency and urgency.  Musculoskeletal: Negative for back pain and joint pain.  Neurological: Positive for weakness. Negative for sensory change, speech change and focal weakness.  Psychiatric/Behavioral: Negative for depression and hallucinations. The patient is not nervous/anxious.    Tolerating Diet: Tolerating PT:   DRUG ALLERGIES:  No Known Allergies  VITALS:  Blood pressure 120/78, pulse (!) 104, temperature 97.6 F (36.4 C), temperature source Oral, resp. rate (!) 22, height 6' (1.829 m), weight 93.7 kg (206 lb 9.1 oz), SpO2 91 %.  PHYSICAL EXAMINATION:   Physical Exam   GENERAL:  60 y.o.-year-old patient lying in the bed with no acute distress. Critically ill EYES: Pupils equal, round, reactive to light and accommodation. No scleral icterus. Extraocular muscles intact.  HEENT: Head atraumatic, normocephalic. Oropharynx and nasopharynx clear. BIPAP+ NECK:  Supple, no jugular venous distention. No thyroid enlargement, no tenderness.  LUNGS:  Decreased breath sounds bilaterally, no wheezing, rales, rhonchi. No use of accessory muscles of respiration.  CARDIOVASCULAR: S1, S2 normal. No murmurs, rubs, or gallops.  Tachycardia/irregular ABDOMEN: Soft, nontender, nondistended. Bowel sounds present. No organomegaly or mass.  EXTREMITIES: No cyanosis, clubbing or edema b/l.    NEUROLOGIC: Cranial nerves II through XII are intact. No focal Motor or sensory deficits b/l.   PSYCHIATRIC:  patient is alert and oriented x 3.  SKIN: No obvious rash, lesion, or ulcer.   LABORATORY PANEL:  CBC  Recent Labs Lab 03/01/16 0400  WBC 6.4  HGB 12.1*  HCT 35.2*  PLT 122*    Chemistries   Recent Labs Lab 02/29/16 1711 03/01/16 0400  NA 128* 127*  K 4.5 4.9  CL 90* 91*  CO2 25 21*  GLUCOSE 89 144*  BUN 45* 51*  CREATININE 9.52* 10.04*  CALCIUM 8.1* 8.0*  MG 1.6*  --   AST  --  30  ALT  --  17  ALKPHOS  --  166*  BILITOT  --  1.0   Cardiac Enzymes  Recent Labs Lab 02/29/16 1711  TROPONINI <0.03   RADIOLOGY:  Dg Chest 2 View  Result Date: 02/29/2016 CLINICAL DATA:  Shortness of breath for the past few weeks EXAM: CHEST  2 VIEW COMPARISON:  12/31/2015 FINDINGS: Chronic small left pleural effusion and/or pleural thickening with associated linear parenchymal scarring. Chronic cardiomegaly. No edema or air bronchogram. Presumed nipple shadow over the peripheral right lower chest. IMPRESSION: 1. No acute finding.  Stable compared to 12/31/2015. 2. Chronic small left pleural effusion and basilar scarring. Electronically Signed   By: Marnee Spring M.D.   On: 02/29/2016 17:59   ASSESSMENT AND PLAN:  60 y.o. male with a history of end-stage renal disease on hemodialysis, COPD, CHF, A. fib now being admitted with:  1. Acute hypoxic respiratory failure secondary  to pulmonary edema  - continued BiPAP and oxygen therapy, nebulizers as needed. -HD -cont IV steriods 2. COPD exacerbation-steroids and DuoNeb as well as oxygen therapy  3. Atrial fibrillation with rapid ventricular response-patient now rate controlled on Cardizem drip. We'll continue by mouth Cardizem.  4. Acute systolic and diastolic  congestive heart failure-patient is due for dialysis tomorrow. Continue Lasix 80 mg [facility]  5. History of end-stage renal disease on hemodialysis-continue PhosLo and Renvela  6. History of BPH-continue Flomax  7. History of hypertension-continue lisinopril and Toprol  Case discussed with Care Management/Social Worker. Management plans discussed with the patient, family and they are in agreement.  CODE STATUS: FULL DVT Prophylaxis: Heparin TOTAL TIME TAKING CARE OF THIS PATIENT: 35 minutes.  >50% time spent on counselling and coordination of care  POSSIBLE D/C IN 2-3 DAYS, DEPENDING ON CLINICAL CONDITION.  Note: This dictation was prepared with Dragon dictation along with smaller phrase technology. Any transcriptional errors that result from this process are unintentional.  Prisilla Kocsis M.D on 03/01/2016 at 4:05 PM  Between 7am to 6pm - Pager - [phone]  After 6pm go to [email or web address] - password [email or web address]  [facility]  Office  [phone]  CC: Primary care physician; [facility]

## 2016-03-01 NOTE — Progress Notes (Signed)
Hemodialysis completed without issue 

## 2016-03-02 MED ORDER — PREDNISONE 20 MG PO TABS: 50.0000 mg | ORAL_TABLET | Freq: Every day | ORAL | Status: DC

## 2016-03-02 MED ORDER — TROLAMINE SALICYLATE 10 % EX CREA: TOPICAL_CREAM | Freq: Two times a day (BID) | CUTANEOUS | 0 refills | Status: AC | PRN

## 2016-03-02 MED ORDER — MOMETASONE FURO-FORMOTEROL FUM 200-5 MCG/ACT IN AERO: 2.0000 | INHALATION_SPRAY | Freq: Two times a day (BID) | RESPIRATORY_TRACT | 1 refills | Status: AC

## 2016-03-02 MED ORDER — IPRATROPIUM-ALBUTEROL 0.5-2.5 (3) MG/3ML IN SOLN: 3.0000 mL | Freq: Four times a day (QID) | RESPIRATORY_TRACT | 0 refills | Status: AC | PRN

## 2016-03-02 MED ORDER — MOMETASONE FURO-FORMOTEROL FUM 200-5 MCG/ACT IN AERO
2.0000 | INHALATION_SPRAY | Freq: Two times a day (BID) | RESPIRATORY_TRACT | Status: DC
  Filled 2016-03-02: qty 8.8

## 2016-03-02 MED ORDER — GUAIFENESIN ER 600 MG PO TB12
600.0000 mg | ORAL_TABLET | Freq: Every day | ORAL | Status: DC | PRN
Start: 2016-03-02 — End: 2016-03-02

## 2016-03-02 MED ORDER — PREDNISONE 10 MG PO TABS
50.0000 mg | ORAL_TABLET | Freq: Every day | ORAL | 0 refills | Status: AC
Start: 2016-03-03 — End: ?

## 2016-03-02 NOTE — Plan of Care (Signed)
AVS was printed and nurse went over discharge teaching. Pt verbalized understanding. IV dc.   Pt on oxygen 3 l/min via [place]. Home Oxygen delivered at bedside. Pt belongings returned Pt waiting for his ride home from daughter who was on her way  Pt stated he would follow up with [facility] md, and that he would call and schedule. Pt verbalized understanding

## 2016-03-02 NOTE — Plan of Care (Signed)
Per MD pt can travel off the floor without nurse for procedures. Placed orders to transfer to 2a telemetry with cardiac monitoring. Pt being transferred to dialysis at this time Spoke to HD RN and updated him on his care

## 2016-03-02 NOTE — Care Management Note (Signed)
Case Management Note  Patient Details  Name: Blake Herrera MRN: [id number] Date of Birth: Nov 09, 1955  Subjective/Objective:                   Met with patient to discuss discharge planning. He lives with two adult daughters (Blake Herrera and Blake Herrera)- or they live with him. He states he is independent and drives himself to dialysis. He states his PCP name cannot recall however they are with [facility]. He is not on O2 at home. He denies problems obtaining medications or getting to appointments. Per progression rounds patient to be dialyzed today. Patient denies RNCM needs. Denies need for home health nursing or Physical therapy too.  Action/Plan: [facility]) [phone] confirmed he is a dialysis patient of theirs (T, Th, Sat at 5:30AM). He is faithful with appointments.   Expected Discharge Date:                  Expected Discharge Plan:     In-House Referral:     Discharge planning Services  CM Consult  Post Acute Care Choice:    Choice offered to:  Patient  DME Arranged:    DME Agency:     HH Arranged:    [place] Agency:     Status of Service:  Completed, signed off  If discussed at [employer] of Stay Meetings, dates discussed:    Additional Comments:  Blake Garfinkel, RN 03/02/2016, 12:54 PM

## 2016-03-02 NOTE — Progress Notes (Signed)
Subjective:   Patient known to us from previous admissions Presents with wheezing and SOB Required NIPPV overnight 3 Liters removed with HD yesterday Breathing is better today   Objective:  Vital signs in last 24 hours:  Temp:  [97.2 F (36.2 C)-97.9 F (36.6 C)] 97.2 F (36.2 C) (09/20 0700) Pulse Rate:  [35-139] 63 (09/20 0900) Resp:  [18-38] 21 (09/20 0900) BP: (90-150)/(50-108) 115/97 (09/20 0900) SpO2:  [81 %-99 %] 93 % (09/20 0900) FiO2 (%):  [35 %-36 %] 36 % (09/20 0750)  Weight change:  Filed Weights   02/29/16 1705 03/01/16 0400  Weight: 90.7 kg (200 lb) 93.7 kg (206 lb 9.1 oz)    Intake/Output:    Intake/Output Summary (Last 24 hours) at 03/02/16 1004 Last data filed at 03/02/16 0700  Gross per 24 hour  Intake           239.25 ml  Output             3225 ml  Net         -2985.75 ml     Physical Exam: General: NAD, laying in bed  HEENT Anicteric, moist mucus membranes  Neck + JVD  Pulm/lungs B/l diffuse rhonchi and wheezing  CVS/Heart Irregular, A Fib on telemetry  Abdomen:  Soft, NT  Extremities: Trace peripheral edema  Neurologic: Alert, oreiented  Skin: No acute rashes  Access: AVF       Basic Metabolic Panel:   Recent Labs Lab 02/29/16 1711 03/01/16 0400  NA 128* 127*  K 4.5 4.9  CL 90* 91*  CO2 25 21*  GLUCOSE 89 144*  BUN 45* 51*  CREATININE 9.52* 10.04*  CALCIUM 8.1* 8.0*  MG 1.6*  --   PHOS  --  5.6*     CBC:  Recent Labs Lab 02/29/16 1711 03/01/16 0400  WBC 8.2 6.4  HGB 12.0* 12.1*  HCT 34.9* 35.2*  MCV 94.4 94.0  PLT 111* 122*      Microbiology:  Recent Results (from the past 720 hour(s))  MRSA PCR Screening     Status: None   Collection Time: 03/01/16  3:41 AM  Result Value Ref Range Status   MRSA by PCR NEGATIVE NEGATIVE Final    Comment:        The GeneXpert MRSA Assay (FDA approved for NASAL specimens only), is one component of a comprehensive MRSA colonization surveillance program. It is  not intended to diagnose MRSA infection nor to guide or monitor treatment for MRSA infections.     Coagulation Studies: No results for input(s): LABPROT, INR in the last 72 hours.  Urinalysis: No results for input(s): COLORURINE, LABSPEC, PHURINE, GLUCOSEU, HGBUR, BILIRUBINUR, KETONESUR, PROTEINUR, UROBILINOGEN, NITRITE, LEUKOCYTESUR in the last 72 hours.  Invalid input(s): APPERANCEUR    Imaging: Dg Chest 2 View  Result Date: 02/29/2016 CLINICAL DATA:  Shortness of breath for the past few weeks EXAM: CHEST  2 VIEW COMPARISON:  12/31/2015 FINDINGS: Chronic small left pleural effusion and/or pleural thickening with associated linear parenchymal scarring. Chronic cardiomegaly. No edema or air bronchogram. Presumed nipple shadow over the peripheral right lower chest. IMPRESSION: 1. No acute finding.  Stable compared to 12/31/2015. 2. Chronic small left pleural effusion and basilar scarring. Electronically Signed   By: Marnee SpringJonathon  Watts M.D.   On: 02/29/2016 17:59     Medications:   . diltiazem (CARDIZEM) infusion Stopped (03/01/16 1337)   . calcium acetate  1,334 mg Oral TID WC  . diltiazem  120 mg Oral BID  .  furosemide  80 mg Oral BID  . heparin subcutaneous  5,000 Units Subcutaneous Q12H  . ipratropium-albuterol  3 mL Nebulization Q6H  . lisinopril  5 mg Oral Daily  . methylPREDNISolone (SOLU-MEDROL) injection  60 mg Intravenous Q8H  . metoprolol succinate  50 mg Oral QHS  . sevelamer carbonate  1,600-2,400 mg Oral 5 X Daily  . sodium chloride flush  3 mL Intravenous Q12H  . sodium chloride flush  3 mL Intravenous Q12H  . tamsulosin  0.4 mg Oral QHS   sodium chloride, acetaminophen **OR** acetaminophen, bisacodyl, HYDROcodone-acetaminophen, ipratropium-albuterol **FOLLOWED BY** [START ON 03/04/2016] ipratropium-albuterol, magnesium citrate, ondansetron **OR** ondansetron (ZOFRAN) IV, senna-docusate, sodium chloride flush, trolamine salicylate, zolpidem  Assessment/ Plan:  60  y.o. male with end-stage renal disease, COPD, history of duodenal ulcer with GI bleed, obstructive sleep apnea, anemia, pulmonary hypertension, depression, history of loculated left pleural effusion in July 2015, gout, prior history of pancreatitis  1. End-stage renal disease. [place] dialysis. Tuesday/ Thursday/Saturday Will arrange for HD this admission EXTRA HD today for voljume optimization. UF as tolerated. Goal 2-2.5 kg as tolerated  2. Atrial fibrillation   Plan as per internal medicine  3. Anemia of chronic kidney disease Hemoglobin acceptable Hold procrit as Hgb > 12  4. Shortness of breath Probably multifactorial including COPD exacerbation, pulm edema counseled patient to quit smoking (currently 0.5 ppd) Limit fluid intake to ~ 1200 cc/day     LOS: 2 Blake Herrera 9/20/201710:04 AM

## 2016-03-02 NOTE — Plan of Care (Signed)
Problem: Education: Goal: Knowledge of [facility] Education information/materials will improve Outcome: Progressing BP 106/75   Pulse 64   Temp 97.2 F (36.2 C) (Oral)   Resp (!) 21   Ht 6' (1.829 m)   Wt 93.7 kg (206 lb 9.1 oz)   SpO2 97%   BMI 28.02 kg/m  POC reviewed with patient, cont on self repositioning, vital signs closely monitored and any concerns addressed at this time. Will continue to monitor.  Pt just experiencing inspiratory and expiratory wheezing. Pt states that he feels so much better today. HD was able to take out 3 litres out and tolerate. Britt and thrill present on left upper arm

## 2016-03-02 NOTE — Progress Notes (Signed)
Dialysis complete

## 2016-03-02 NOTE — Plan of Care (Signed)
Gave report to [place] r[place] for 2a telemetry

## 2016-03-02 NOTE — Therapy (Signed)
Called to patient room by nurse after dialysis due to audible wheezes. PRN neb tx given.

## 2016-03-02 NOTE — Progress Notes (Signed)
Post Dialysis 

## 2016-03-02 NOTE — Care Management (Signed)
Notified by Dr. Allena KatzPatel that patient will need home nebulizer and home O2 prior to discharge to home today. [facility] I have also talked to patient's nurse regarding O2 sat documentation. Patient can discharge when O2 and nebulizer machine has been delivered to this room and MD has completed discharge order.

## 2016-03-02 NOTE — Discharge Summary (Signed)
[facility] - [place] [facility]   PATIENT NAME: Blake Herrera    MR#:  [id number]  DATE OF BIRTH:  1955-06-28  DATE OF ADMISSION:  02/29/2016 ADMITTING PHYSICIAN: Tonye RoyaltyAlexis Hugelmeyer, DO  DATE OF DISCHARGE: 03/02/16  PRIMARY CARE PHYSICIAN: [facility]    ADMISSION DIAGNOSIS:  ESRD (end stage renal disease) (HCC) [N18.6] COPD exacerbation (HCC) [J44.1] Acute combined systolic and diastolic congestive heart failure (HCC) [I50.41] Acute respiratory failure with hypoxia (HCC) [J96.01] Atrial fibrillation with RVR (HCC) [I48.91]  DISCHARGE DIAGNOSIS:  Acute on chronic CHF-diastolic with interstial edema -acute on chronic copd flare ESRD hypoxia SECONDARY DIAGNOSIS:   Past Medical History:  Diagnosis Date  . A-fib (HCC)   . A-fib (HCC)   . Anemia   . Arrhythmia    a-fib  . Asthma   . COPD, severe (HCC)   . Depression   . Dialysis patient (HCC)   . ESRD (end stage renal disease) (HCC)   . GIB (gastrointestinal bleeding)   . Gout   . H. pylori infection   . H/O pyelonephritis   . Hypertension   . Insomnia   . OSA (obstructive sleep apnea)   . Pancreatitis   . Pleural effusion   . Pulmonary HTN (HCC)   . Tobacco abuse     HOSPITAL COURSE:  60 y.o.malewith a history of end-stage renal disease on hemodialysis, COPD, CHF, A. fibnow being admitted with:  1. Acute hypoxic respiratory failure secondary to pulmonary edema  - continued BiPAP and oxygen therapy, nebulizers as needed. -HD -cont IV steriods---po steroids  2. COPD exacerbation-steroids and DuoNeb as well as oxygen therapy Pt will need home oxygen therapy  3. Atrial fibrillation with rapid ventricular response-patient now rate controlled on Cardizem drip. We'll continue by mouth Cardizem.  4. Acute systolic and diastolic congestive heart failure-patient is due for dialysis tomorrow. Continue Lasix 80 mg by mouth twice a day  -pt dialyzed with ultrafiltration  5.  History of end-stage renal disease on hemodialysis-continue PhosLo and Renvela  6. History of BPH-continue Flomax  7. History of hypertension-continue lisinopril and Toprol  CONSULTS OBTAINED:  Treatment Team:  Mosetta PigeonHarmeet Singh, MD  DRUG ALLERGIES:  No Known Allergies  DISCHARGE MEDICATIONS:   Current Discharge Medication List    START taking these medications   Details  ipratropium-albuterol (DUONEB) 0.5-2.5 (3) MG/3ML SOLN Take 3 mLs by nebulization every 6 (six) hours as needed. Qty: 360 mL, Refills: 0    mometasone-formoterol (DULERA) 200-5 MCG/ACT AERO Inhale 2 puffs into the lungs 2 (two) times daily. Qty: 1 Inhaler, Refills: 1    predniSONE (DELTASONE) 10 MG tablet Take 5 tablets (50 mg total) by mouth daily with breakfast. Take 50 mg po daily taper by 10 mg daily then stop Qty: 15 tablet, Refills: 0    trolamine salicylate (ASPERCREME) 10 % cream Apply topically 2 (two) times daily as needed for muscle pain. Qty: 85 g, Refills: 0      CONTINUE these medications which have NOT CHANGED   Details  albuterol (PROVENTIL HFA;VENTOLIN HFA) 108 (90 Base) MCG/ACT inhaler Inhale 2 puffs into the lungs every 6 (six) hours as needed for wheezing or shortness of breath. Qty: 1 Inhaler, Refills: 2    calcium acetate (PHOSLO) 667 MG capsule Take 1,334 mg by mouth 3 (three) times daily with meals.     diltiazem (CARDIZEM CD) 120 MG 24 hr capsule Take 1 capsule (120 mg total) by mouth 2 (two) times daily. Qty: 180  capsule, Refills: 3    furosemide (LASIX) 80 MG tablet Take 80 mg by mouth 2 (two) times daily.    lisinopril (PRINIVIL,ZESTRIL) 5 MG tablet Take 5 mg by mouth daily.    metoprolol succinate (TOPROL-XL) 50 MG 24 hr tablet Take 50 mg by mouth at bedtime.    sevelamer carbonate (RENVELA) 800 MG tablet Take 1,600-2,400 mg by mouth 5 (five) times daily. Pt takes three capsules three times a day with meals and two capsules two times a day with snacks.    tamsulosin  (FLOMAX) 0.4 MG CAPS capsule Take 0.4 mg by mouth at bedtime.         If you experience worsening of your admission symptoms, develop shortness of breath, life threatening emergency, suicidal or homicidal thoughts you must seek medical attention immediately by calling 911 or calling your MD immediately  if symptoms less severe.  You Must read complete instructions/literature along with all the possible adverse reactions/side effects for all the Medicines you take and that have been prescribed to you. Take any new Medicines after you have completely understood and accept all the possible adverse reactions/side effects.   Please note  You were cared for by a hospitalist during your hospital stay. If you have any questions about your discharge medications or the care you received while you were in the hospital after you are discharged, you can call the unit and asked to speak with the hospitalist on call if the hospitalist that took care of you is not available. Once you are discharged, your primary care physician will handle any further medical issues. Please note that NO REFILLS for any discharge medications will be authorized once you are discharged, as it is imperative that you return to your primary care physician (or establish a relationship with a primary care physician if you do not have one) for your aftercare needs so that they can reassess your need for medications and monitor your lab values. Today   SUBJECTIVE   Doing well wants to go home  VITAL SIGNS:  Blood pressure 108/80, pulse 71, temperature 98.2 F (36.8 C), temperature source Oral, resp. rate (!) 24, height 6' (1.829 m), weight 88.1 kg (194 lb 3.6 oz), SpO2 94 %.  I/O:    Intake/Output Summary (Last 24 hours) at 03/02/16 1505 Last data filed at 03/02/16 1407  Gross per 24 hour  Intake              120 ml  Output             5625 ml  Net            -5505 ml    PHYSICAL EXAMINATION:  GENERAL:  60 y.o.-year-old  patient lying in the bed with no acute distress.  EYES: Pupils equal, round, reactive to light and accommodation. No scleral icterus. Extraocular muscles intact.  HEENT: Head atraumatic, normocephalic. Oropharynx and nasopharynx clear.  NECK:  Supple, no jugular venous distention. No thyroid enlargement, no tenderness.  LUNGS: distant BS bilaterally, no wheezing, rales,rhonchi or crepitation. No use of accessory muscles of respiration.  CARDIOVASCULAR: S1, S2 normal. No murmurs, rubs, or gallops.  ABDOMEN: Soft, non-tender, non-distended. Bowel sounds present. No organomegaly or mass.  EXTREMITIES: No pedal edema, cyanosis, or clubbing.  NEUROLOGIC: Cranial nerves II through XII are intact. Muscle strength 5/5 in all extremities. Sensation intact. Gait not checked.  PSYCHIATRIC: patient is alert and oriented x 3.  SKIN: No obvious rash, lesion, or ulcer.   DATA  REVIEW:   CBC   Recent Labs Lab 03/01/16 0400  WBC 6.4  HGB 12.1*  HCT 35.2*  PLT 122*    Chemistries   Recent Labs Lab 02/29/16 1711 03/01/16 0400  NA 128* 127*  K 4.5 4.9  CL 90* 91*  CO2 25 21*  GLUCOSE 89 144*  BUN 45* 51*  CREATININE 9.52* 10.04*  CALCIUM 8.1* 8.0*  MG 1.6*  --   AST  --  30  ALT  --  17  ALKPHOS  --  166*  BILITOT  --  1.0    Microbiology Results   Recent Results (from the past 240 hour(s))  MRSA PCR Screening     Status: None   Collection Time: 03/01/16  3:41 AM  Result Value Ref Range Status   MRSA by PCR NEGATIVE NEGATIVE Final    Comment:        The GeneXpert MRSA Assay (FDA approved for NASAL specimens only), is one component of a comprehensive MRSA colonization surveillance program. It is not intended to diagnose MRSA infection nor to guide or monitor treatment for MRSA infections.     RADIOLOGY:  Dg Chest 2 View  Result Date: 02/29/2016 CLINICAL DATA:  Shortness of breath for the past few weeks EXAM: CHEST  2 VIEW COMPARISON:  12/31/2015 FINDINGS: Chronic small  left pleural effusion and/or pleural thickening with associated linear parenchymal scarring. Chronic cardiomegaly. No edema or air bronchogram. Presumed nipple shadow over the peripheral right lower chest. IMPRESSION: 1. No acute finding.  Stable compared to 12/31/2015. 2. Chronic small left pleural effusion and basilar scarring. Electronically Signed   By: Marnee Spring M.D.   On: 02/29/2016 17:59     Management plans discussed with the patient, family and they are in agreement.  CODE STATUS:     Code Status Orders        Start     Ordered   03/01/16 0341  Full code  Continuous     03/01/16 0341    Code Status History    Date Active Date Inactive Code Status Order ID Comments User Context   06/22/2015  7:17 PM 06/23/2015 10:18 PM Full Code [id number]  Alford Highland, MD ED      TOTAL TIME TAKING CARE OF THIS PATIENT: 40 minutes.    Hashim Eichhorst M.D on 03/02/2016 at 3:05 PM  Between 7am to 6pm - Pager - [phone] After 6pm go to [email or web address] - password [email or web address] [facility]  [facility]  Office  [phone]  CC: Primary care physician; [facility]

## 2016-03-02 NOTE — Progress Notes (Signed)
Pre Dialysis 

## 2016-03-02 NOTE — SANE Note (Signed)
.  sat SATURATION QUALIFICATIONS: (This note is used to comply with regulatory documentation for home oxygen)  Patient Saturations on Room Air at Rest = 85 %  Patient Saturations on Room Air while Ambulating = 83%  Patient Saturations on 3 Liters of oxygen while Ambulating = 91%  Please briefly explain why patient needs home oxygen: COPD exacerbation

## 2016-03-02 NOTE — Discharge Instructions (Signed)
Use youir oxygen and nebulizer as before

## 2016-03-02 NOTE — Care Management (Signed)
Received call from Dr. Allena KatzPatel requesting that I talk with patient about home health nurse to follow after this visit which I did.Patient agrees with home health nursing and did not have preference on agencies provided to chose from. [facility]. O2 has been delivered by [facility]. No further RNCM needs.

## 2016-03-03 LAB — HEPATITIS B SURFACE ANTIBODY, QUANTITATIVE

## 2016-03-03 LAB — HEPATITIS B SURFACE ANTIGEN: HEP B S AG: NEGATIVE

## 2016-03-08 NOTE — Progress Notes (Signed)
[facility]  Patient Status: Closed, patient declined services. Stated he is getting along fine and does not need services at this time.     Blake CaseyJason E Herrera 03/08/2016, 9:42 AM

## 2016-03-30 ENCOUNTER — Ambulatory Visit: Payer: Medicare Other | Admitting: Cardiovascular Disease

## 2016-04-25 ENCOUNTER — Ambulatory Visit: Payer: Medicare Other | Admitting: Cardiovascular Disease

## 2016-04-25 ENCOUNTER — Encounter: Payer: Self-pay | Admitting: *Deleted

## 2016-07-25 ENCOUNTER — Emergency Department
Admission: EM | Admit: 2016-07-25 | Discharge: 2016-07-25 | Disposition: A | Discharge status: Home / Self Care | Payer: Medicare Other | Attending: Emergency Medicine | Admitting: Emergency Medicine

## 2016-07-25 ENCOUNTER — Encounter: Payer: Self-pay | Admitting: Emergency Medicine

## 2016-07-25 ENCOUNTER — Emergency Department: Payer: Medicare Other

## 2016-07-25 DIAGNOSIS — K5900 Constipation, unspecified: Secondary | ICD-10-CM | POA: Insufficient documentation

## 2016-07-25 DIAGNOSIS — Z79899 Other long term (current) drug therapy: Secondary | ICD-10-CM | POA: Diagnosis not present

## 2016-07-25 DIAGNOSIS — F17210 Nicotine dependence, cigarettes, uncomplicated: Secondary | ICD-10-CM | POA: Diagnosis not present

## 2016-07-25 DIAGNOSIS — I959 Hypotension, unspecified: Secondary | ICD-10-CM

## 2016-07-25 DIAGNOSIS — J45909 Unspecified asthma, uncomplicated: Secondary | ICD-10-CM | POA: Diagnosis not present

## 2016-07-25 DIAGNOSIS — N186 End stage renal disease: Secondary | ICD-10-CM | POA: Diagnosis not present

## 2016-07-25 DIAGNOSIS — J441 Chronic obstructive pulmonary disease with (acute) exacerbation: Secondary | ICD-10-CM | POA: Insufficient documentation

## 2016-07-25 DIAGNOSIS — K6289 Other specified diseases of anus and rectum: Secondary | ICD-10-CM | POA: Diagnosis present

## 2016-07-25 DIAGNOSIS — J90 Pleural effusion, not elsewhere classified: Secondary | ICD-10-CM | POA: Diagnosis not present

## 2016-07-25 DIAGNOSIS — R198 Other specified symptoms and signs involving the digestive system and abdomen: Secondary | ICD-10-CM

## 2016-07-25 LAB — CBC
HCT: 34.3 % — ABNORMAL LOW (ref 40.0–52.0)
Hemoglobin: 11.8 g/dL — ABNORMAL LOW (ref 13.0–18.0)
MCH: 32.1 pg (ref 26.0–34.0)
MCHC: 34.5 g/dL (ref 32.0–36.0)
MCV: 93 fL (ref 80.0–100.0)
PLATELETS: 158 10*3/uL (ref 150–440)
RBC: 3.68 MIL/uL — AB (ref 4.40–5.90)
RDW: 15.5 % — ABNORMAL HIGH (ref 11.5–14.5)
WBC: 9 10*3/uL (ref 3.8–10.6)

## 2016-07-25 LAB — COMPREHENSIVE METABOLIC PANEL
ALT: 11 U/L — AB (ref 17–63)
AST: 21 U/L (ref 15–41)
Albumin: 3.1 g/dL — ABNORMAL LOW (ref 3.5–5.0)
Alkaline Phosphatase: 153 U/L — ABNORMAL HIGH (ref 38–126)
Anion gap: 15 (ref 5–15)
BUN: 72 mg/dL — ABNORMAL HIGH (ref 6–20)
CALCIUM: 8.2 mg/dL — AB (ref 8.9–10.3)
CHLORIDE: 95 mmol/L — AB (ref 101–111)
CO2: 22 mmol/L (ref 22–32)
CREATININE: 15.89 mg/dL — AB (ref 0.61–1.24)
GFR calc non Af Amer: 3 mL/min — ABNORMAL LOW (ref >60)
GFR, EST AFRICAN AMERICAN: 3 mL/min — AB (ref >60)
Glucose, Bld: 82 mg/dL (ref 65–99)
Potassium: 4.5 mmol/L (ref 3.5–5.1)
Sodium: 132 mmol/L — ABNORMAL LOW (ref 135–145)
Total Bilirubin: 0.7 mg/dL (ref 0.3–1.2)
Total Protein: 6.6 g/dL (ref 6.5–8.1)

## 2016-07-25 LAB — LIPASE, BLOOD: LIPASE: 20 U/L (ref 11–51)

## 2016-07-25 MED ORDER — LACTULOSE 10 GM/15ML PO SOLN
30.0000 g | Freq: Once | ORAL | Status: AC
  Administered 2016-07-25: 30 g via ORAL
  Filled 2016-07-25: qty 60

## 2016-07-25 MED ORDER — FLEET ENEMA 7-19 GM/118ML RE ENEM
1.0000 | ENEMA | Freq: Once | RECTAL | Status: AC
  Administered 2016-07-25: 1 via RECTAL

## 2016-07-25 NOTE — ED Triage Notes (Signed)
States only one BM in past 2 weeks.

## 2016-07-25 NOTE — ED Notes (Signed)
Pt reports having a "good medium" bowel movement post fleet enema.

## 2016-07-25 NOTE — Discharge Instructions (Signed)
Please eat a high-fiber diet and remain active to prevent constipation. You may take an over-the-counter stool softener daily as needed for constipation.  Return to the emergency department if you develop severe pain, fever, vomiting, chest pain or palpitations, shortness of breath, or any other symptoms concerning to you.  It is very important that you go to dialysis tomorrow.

## 2016-07-25 NOTE — ED Provider Notes (Signed)
[facility] Emergency Department Provider Note  ____________________________________________  Time seen: Approximately 11:59 AM  I have reviewed the triage vital signs and the nursing notes.   HISTORY  Chief Complaint Abdominal Pain    HPI Victoriano LainBobby E Flener is a 61 y.o. male with ESRD on HD, HTN, A. fib, presenting for rectal pressure. The patient reports that for the past 3 days, has not had a bowel movement. He has rectal pressure but denies any abdominal pain. He has not had any nausea or vomiting, fever. He has tried a laxative and a stool softener without improvement. He has continued to pass gas.The patient reports a low fiber diet consisted mostly of meats and fried foods   Past Medical History:  Diagnosis Date  . A-fib (HCC)   . A-fib (HCC)   . Anemia   . Arrhythmia    a-fib  . Asthma   . COPD, severe (HCC)   . Depression   . Dialysis patient (HCC)   . ESRD (end stage renal disease) (HCC)   . GIB (gastrointestinal bleeding)   . Gout   . H. pylori infection   . H/O pyelonephritis   . Hypertension   . Insomnia   . OSA (obstructive sleep apnea)   . Pancreatitis   . Pleural effusion   . Pulmonary HTN   . Tobacco abuse     Patient Active Problem List   Diagnosis Date Noted  . Pulmonary edema 02/29/2016  . COPD exacerbation (HCC) 06/22/2015  . Atrial fibrillation, unspecified 07/03/2014  . Smoker 07/03/2014  . Essential hypertension 07/03/2014  . History of GI bleed 07/03/2014  . Pleural effusion 07/03/2014  . End stage renal disease (HCC) 07/03/2014  . Dependence on hemodialysis (HCC) 07/03/2014  . Emphysema of lung (HCC) 07/03/2014  . Preop cardiovascular exam 07/03/2014    Past Surgical History:  Procedure Laterality Date  . Left arm fistula      Current Outpatient Rx  . Order #: [id number] Class: Print  . Order #: [id number] Class: Historical Med  . Order #: [id number] Class: Normal  . Order #: [id number] Class: Historical Med  .  Order #: [id number] Class: Normal  . Order #: [id number] Class: Historical Med  . Order #: [id number] Class: Historical Med  . Order #: [id number] Class: Normal  . Order #: [id number] Class: Normal  . Order #: [id number] Class: Historical Med  . Order #: [id number] Class: Historical Med  . Order #: [id number] Class: Normal    Allergies Patient has no known allergies.  Family History  Problem Relation Age of Onset  . Hypertension Mother   . Hyperlipidemia Mother   . Hypertension Father   . Hyperlipidemia Father   . Hypertension Brother     Social History Social History  Substance Use Topics  . Smoking status: Current Every Day Smoker    Packs/day: 0.50    Years: 35.00    Types: Cigarettes  . Smokeless tobacco: Never Used  . Alcohol use No    Review of Systems Constitutional: No fever/chills.No lightheadedness or Eyes: No visual changes. ENT: No sore throat. No congestion or rhinorrhea. Cardiovascular: Denies chest pain. Denies palpitations. Respiratory: Denies shortness of breath.  No cough. Gastrointestinal: No abdominal pain.  No nausea, no vomiting.  No diarrhea.  Positive constipation. Genitourinary: Negative for dysuria. Positive rectal pressure.  Musculoskeletal: Negative for back pain. Skin: Negative for rash. Neurological: Negative for headaches. No focal numbness, tingling or weakness.   10-point ROS otherwise negative.  ____________________________________________   PHYSICAL EXAM:  VITAL SIGNS: ED Triage Vitals  Enc Vitals Group     BP 07/25/16 0935 (!) 99/55     Pulse Rate 07/25/16 0935 68     Resp 07/25/16 0935 18     Temp 07/25/16 0935 98.5 F (36.9 C)     Temp Source 07/25/16 0935 Oral     SpO2 07/25/16 0935 93 %     Weight 07/25/16 0937 200 lb (90.7 kg)     Height 07/25/16 0937 5\' 11"  (1.803 m)     Head Circumference --      Peak Flow --      Pain Score 07/25/16 0940 6     Pain Loc --      Pain Edu? --      Excl. in GC? --     Constitutional:  Alert and oriented. Well appearing and in no acute distress. Answers questions appropriately. Eyes: Conjunctivae are normal.  EOMI. No scleral icterus. Head: Atraumatic. Nose: No congestion/rhinnorhea. Mouth/Throat: Mucous membranes are moist.  Neck: No stridor.  Supple.   Cardiovascular: Normal rate Respiratory: Normal respiratory effort.  No accessory muscle use or retractions.  Gastrointestinal: Soft, nontender and mildly distended.  No guarding or rebound.  No peritoneal signs. Genitourinary: No evidence of external hemorrhoids; several palpable internal hemorrhoids without pain during rectal examination. Stool is brown. There is no palpable stool ball. Musculoskeletal: No LE edema.  Neurologic:  A&Ox3.  Speech is clear.  Face and smile are symmetric.  EOMI.  Moves all extremities well. Skin:  Skin is warm, dry and intact. No rash noted. Psychiatric: Mood and affect are normal. Speech and behavior are normal.  Normal judgement.  ____________________________________________   LABS (all labs ordered are listed, but only abnormal results are displayed)  Labs Reviewed  COMPREHENSIVE METABOLIC PANEL - Abnormal; Notable for the following:       Result Value   Sodium 132 (*)    Chloride 95 (*)    BUN 72 (*)    Creatinine, Ser 15.89 (*)    Calcium 8.2 (*)    Albumin 3.1 (*)    ALT 11 (*)    Alkaline Phosphatase 153 (*)    GFR calc non Af Amer 3 (*)    GFR calc Af Amer 3 (*)    All other components within normal limits  CBC - Abnormal; Notable for the following:    RBC 3.68 (*)    Hemoglobin 11.8 (*)    HCT 34.3 (*)    RDW 15.5 (*)    All other components within normal limits  LIPASE, BLOOD   ____________________________________________  EKG  Not indicated ____________________________________________  RADIOLOGY  Dg Abdomen 1 View  Result Date: 07/25/2016 CLINICAL DATA:  Rectal pain for 2 weeks EXAM: ABDOMEN - 1 VIEW COMPARISON:  None. FINDINGS: There is a moderate  amount of stool throughout the colon. There is no bowel dilatation to suggest obstruction. There is no evidence of pneumoperitoneum, portal venous gas or pneumatosis. There are no pathologic calcifications along the expected course of the ureters. The osseous structures are unremarkable. Small left pleural effusion. IMPRESSION: Moderate amount of stool throughout the colon. Small left pleural effusion. Electronically Signed   By: Elige Ko   On: 07/25/2016 12:49    ____________________________________________   PROCEDURES  Procedure(s) performed: None  Procedures  Critical Care performed: No ____________________________________________   INITIAL IMPRESSION / ASSESSMENT AND PLAN / ED COURSE  Pertinent labs & imaging results that were available during my care of the patient were reviewed by me and  considered in my medical decision making (see chart for details).  61 y.o. male with ESRD on HD presenting for rectal pressure and constipation. Overall, the patient is not exhibiting symptoms of small bowel obstruction, or any surgical abdominal pathology. He is continued to pass gas and has not had any nausea or vomiting. He may just have constipation, or he may have symptomatic hemorrhoids as he does have internal hemorrhoids on my examination.  The patient skipped his dialysis on Saturday. Here, his blood pressure is 96/65 and he states that his systolic blood pressures generally in the 90s; this is baseline for him. He has no symptoms that would be concerning for acute hypotension including lightheadedness, shortness of breath or syncope. The patient is also afebrile here. His potassium is within normal limits, and I will do screening EKG. He has no signs or symptoms of concerning fluid overload. He is scheduled for dialysis tomorrow. If he has no immediate complications, the patient can wait until tomorrow for dialysis.  ----------------------------------------- 2:54 PM on  07/25/2016 -----------------------------------------  The patient has had good stool output while he was here, and his rectal pressure has improved. From a dialysis standpoint, the patient has a stable hypotension without any symptoms. In addition, he does not have any EKG changes, and his potassium is within normal limits. His abdominal x-ray does show a small pleural effusion, which is likely fluid overload from missing dialysis, but he is not compromised from a respiratory standpoint in any way. I will plan to discharge the patient home with instructions to go to dialysis tomorrow as previously scheduled. He will be given instructions about a high-fiber diet and the use of a stool softener as needed. Plan discharge. Return precautions were discussed.  ____________________________________________  FINAL CLINICAL IMPRESSION(S) / ED DIAGNOSES  Final diagnoses:  Rectal pressure  Constipation, unspecified constipation type  Hypotension, unspecified hypotension type  Pleural effusion         NEW MEDICATIONS STARTED DURING THIS VISIT:  New Prescriptions   No medications on file      Rockne Menghini, MD 07/25/16 1455

## 2016-07-25 NOTE — ED Notes (Signed)
MD at bedside. 

## 2016-08-08 IMAGING — CR DG CHEST 1V PORT
1 series · 2 of 2 positions shown · non-contrast
Comparison: 01/14/2014

CLINICAL DATA: Tachycardia and low saturations.

EXAM:
PORTABLE CHEST - 1 VIEW

[Series 1: ap · 0.17mm/px · 2 of 2 slices shown]
[im 1/2]
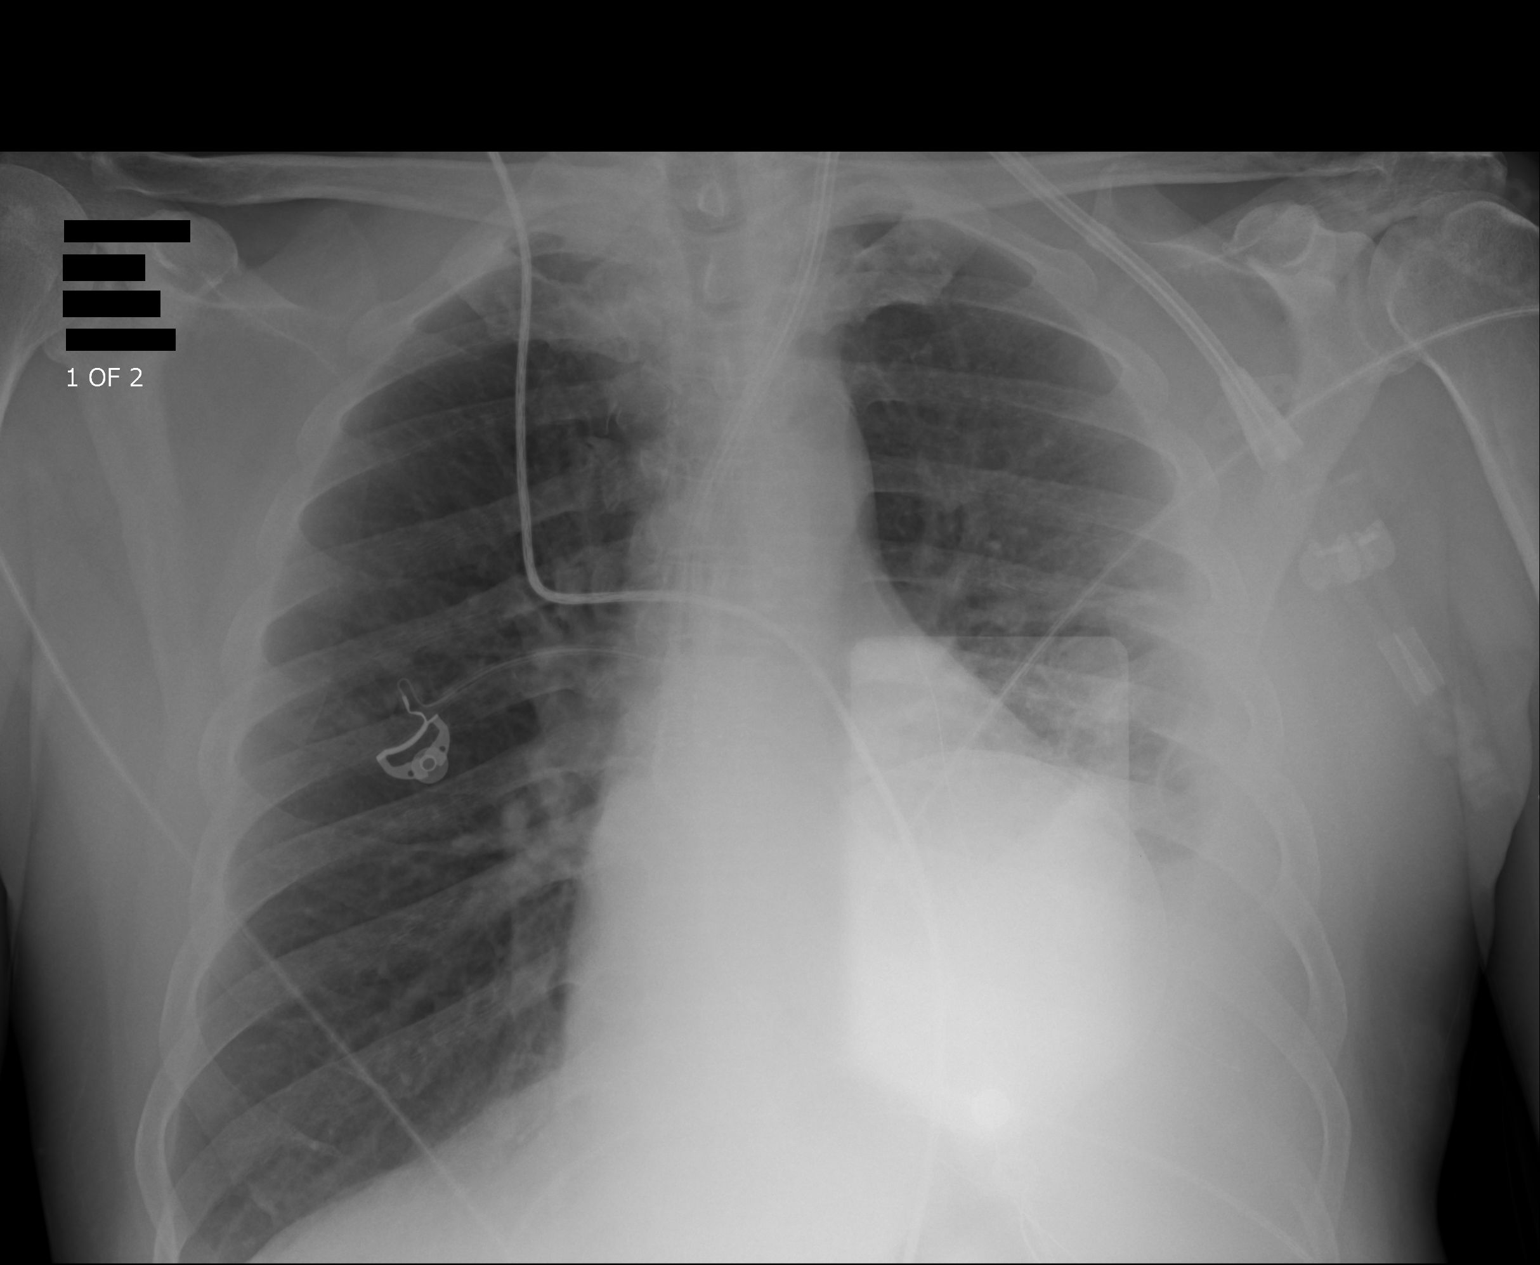
[im 2/2]
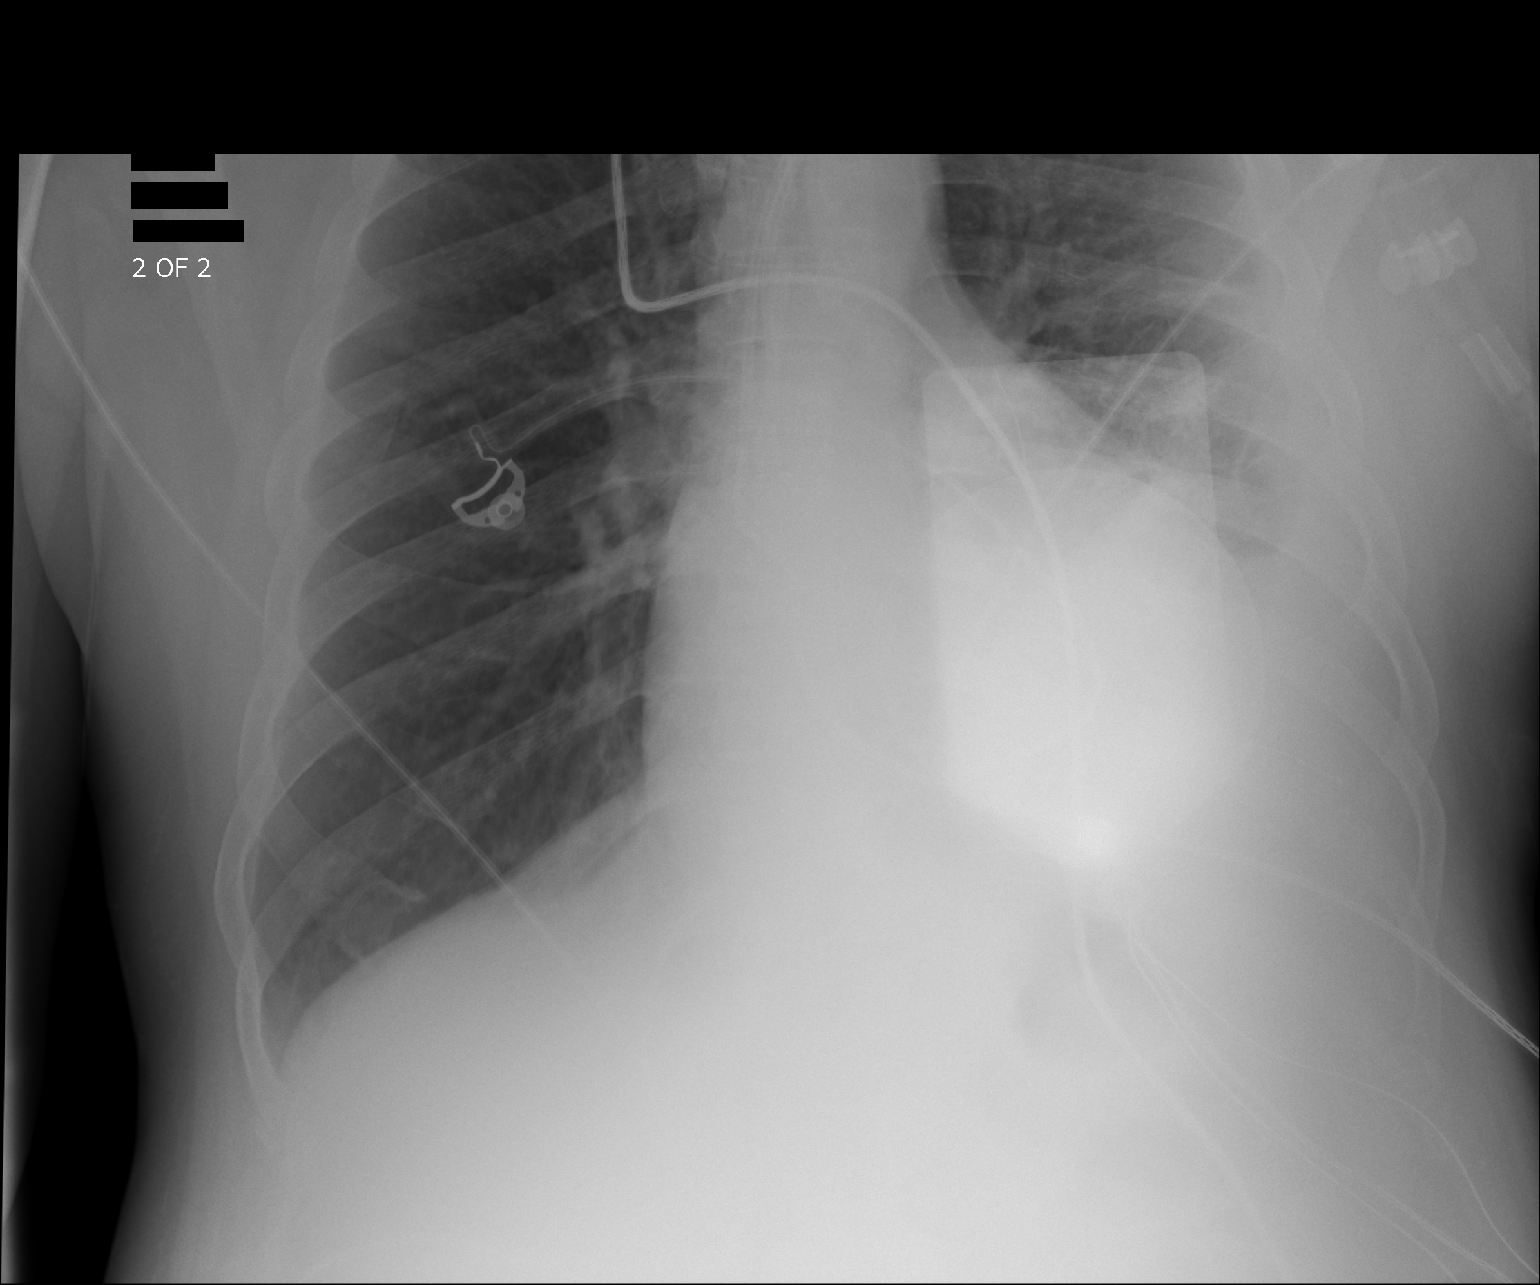

[2 of 2 positions shown; findings below may reference images not displayed]

FINDINGS: There is a left jugular dialysis catheter. Catheter tip is in the
lower SVC region. Cardiac pads overlying the left side of the chest.
Left basilar densities suggest consolidation and pleural fluid.
Heart is obscured by the cardiac pads but heart size appears to be
grossly stable. Negative for a pneumothorax.
IMPRESSION: Persistent left basilar chest densities are compatible with pleural
fluid and consolidation.

Dialysis catheter as described.

## 2016-09-11 DEATH — deceased

## 2018-07-11 IMAGING — CR DG CHEST 2V
1 series · 2 of 2 positions shown · non-contrast
Comparison: September 19, 2015

CLINICAL DATA: Shortness of breath.  Chronic renal failure

EXAM:
CHEST  2 VIEW

[Series 1: dg chest 2 view · 0.14mm/px · 2 of 2 slices shown]
[im 1/2]
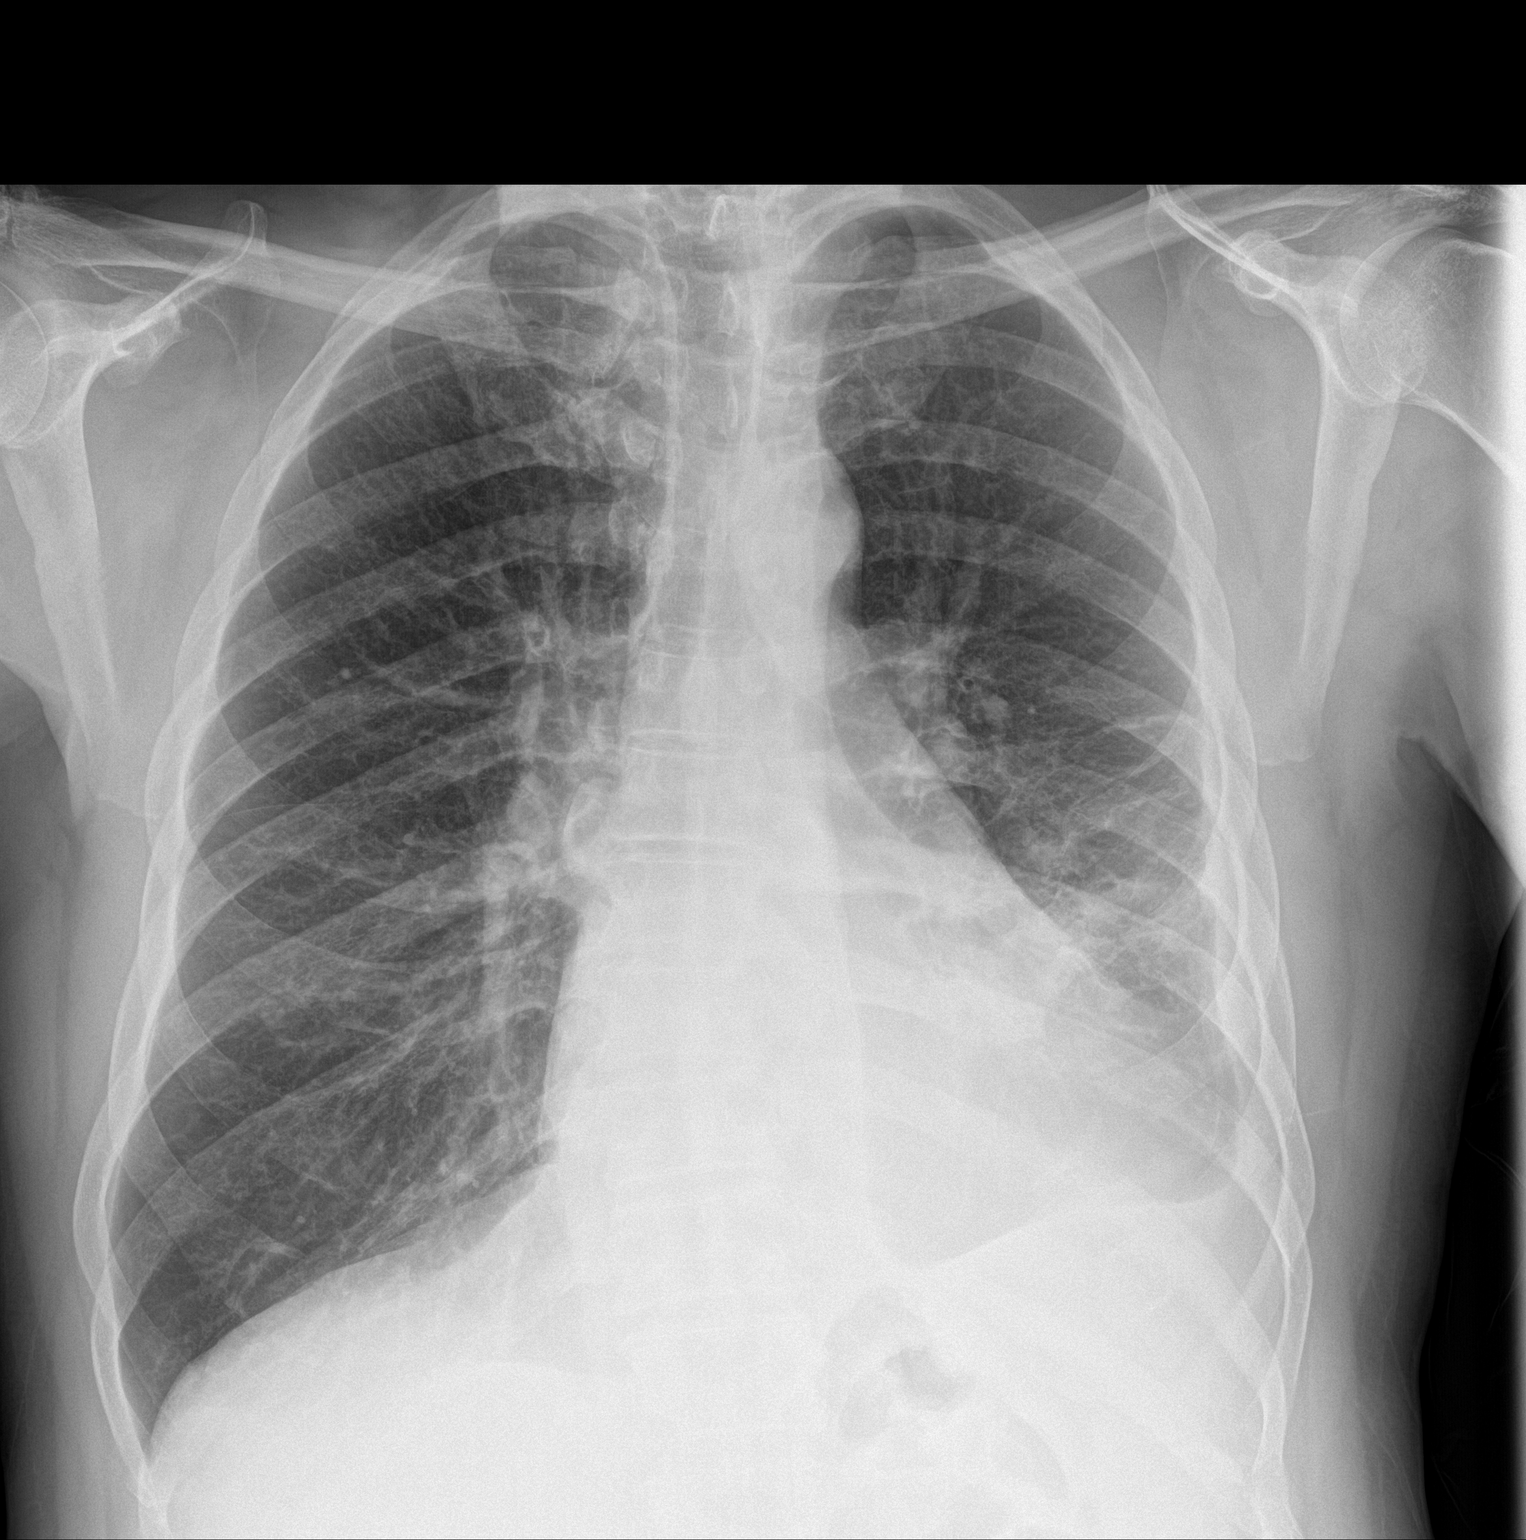
[im 2/2]
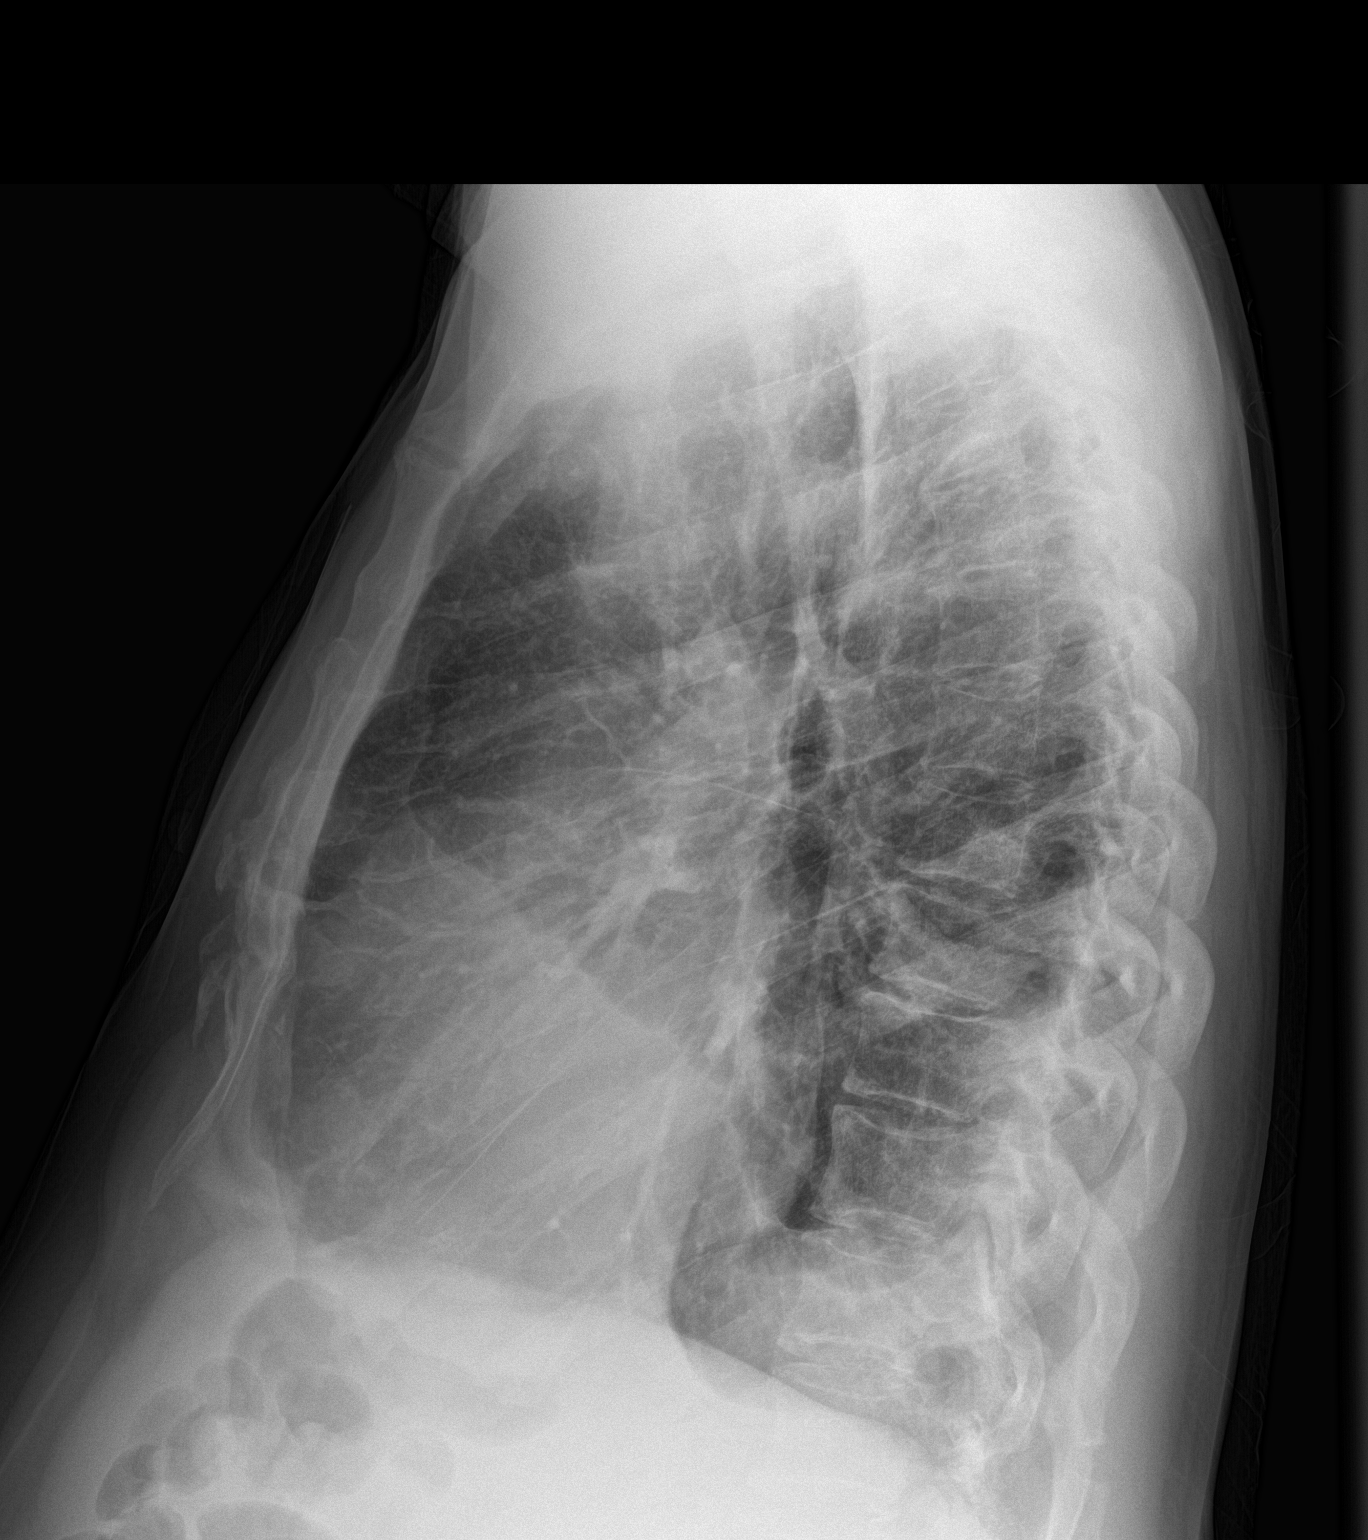

[2 of 2 positions shown; findings below may reference images not displayed]

FINDINGS: There is a chronic appearing left pleural effusion with areas of
scarring and atelectasis in the left mid lower lung zones. There is
no new opacity. Right lung is clear. Heart is upper normal in size
with pulmonary vascularity within normal limits. No adenopathy. No
bone lesions.
IMPRESSION: Chronic loculated left effusion with areas of scarring and
atelectasis in the left mid lower lung zones. No new opacity. Right
lung clear. Stable cardiac silhouette.
